# Patient Record
Sex: Female | Born: 1990 | Race: Black or African American | Hispanic: No | Marital: Single
Health system: Southern US, Community
[De-identification: ages and names within clinical notes are randomized; demographics above are authoritative.]

## PROBLEM LIST (undated history)

## (undated) DIAGNOSIS — E119 Type 2 diabetes mellitus without complications: Secondary | ICD-10-CM

## (undated) DIAGNOSIS — I10 Essential (primary) hypertension: Secondary | ICD-10-CM

## (undated) HISTORY — DX: Type 2 diabetes mellitus without complications: E11.9

---
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
## 2011-10-15 ENCOUNTER — Emergency Department: Payer: Self-pay | Admitting: Emergency Medicine

## 2011-10-16 ENCOUNTER — Observation Stay: Payer: Self-pay | Admitting: Internal Medicine

## 2013-02-19 IMAGING — CT CT HEAD WITHOUT CONTRAST
2 series · 16 of 30 positions shown, 20 images · non-contrast
Comparison: none

REASON FOR EXAM: constant shaking of aRMS/?CHOREA
COMMENTS:

PROCEDURE:     CT  - CT HEAD WITHOUT CONTRAST  - October 16, 2011  [DATE]
RESULT:     Comparison:  None
TECHNIQUE: Multiple axial images from the foramen magnum to the vertex were
obtained without IV contrast.

[Series 2: without · axial · non-contrast · 0.43mm/px · z∈[-272,-152]mm · 13 of 30 slices shown, 17 images]
[im 3/30  brain]
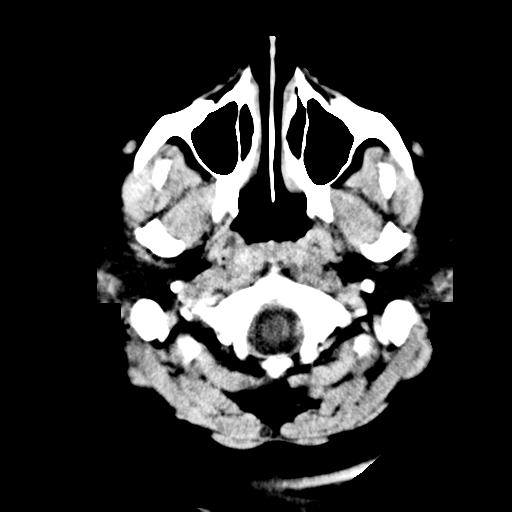
[im 3/30  bone]
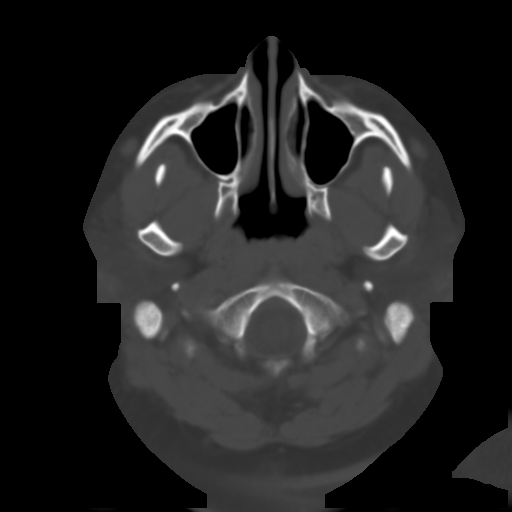
[im 5/30  brain]
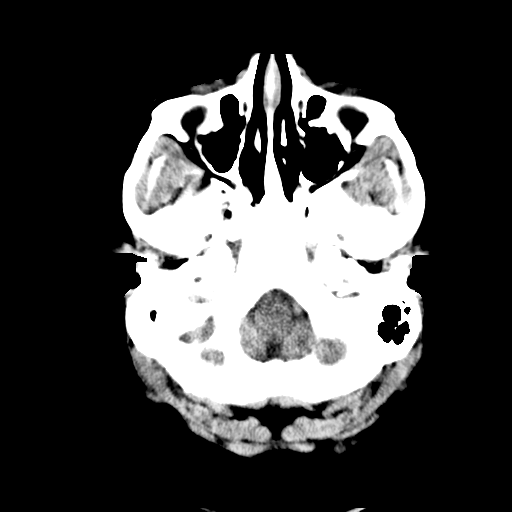
[im 7/30  brain]
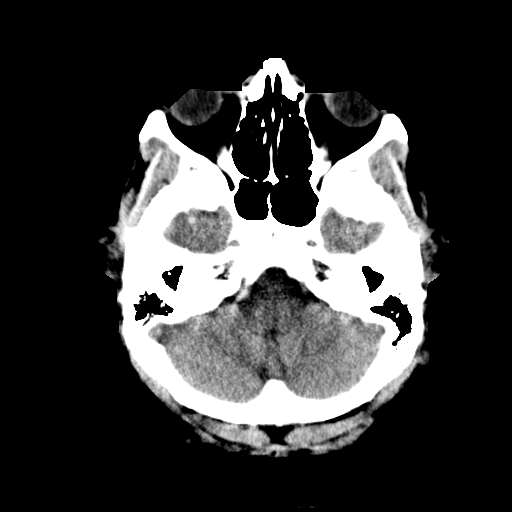
[im 9/30  brain]
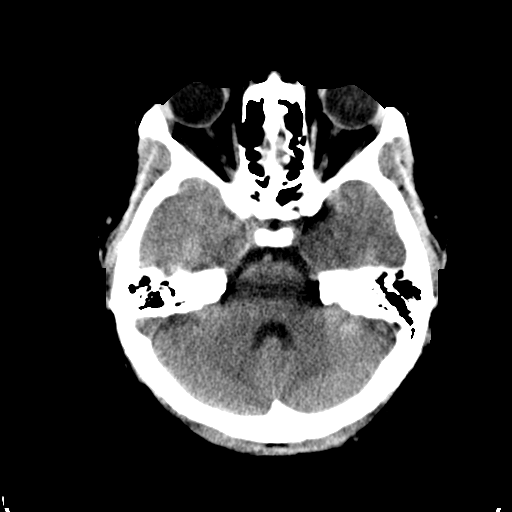
[im 11/30  brain]
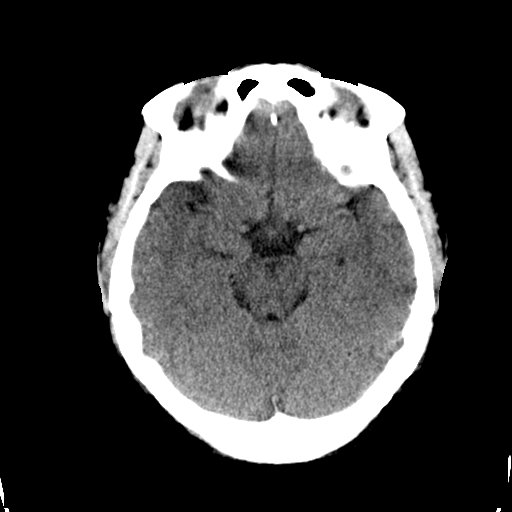
[im 11/30  bone]
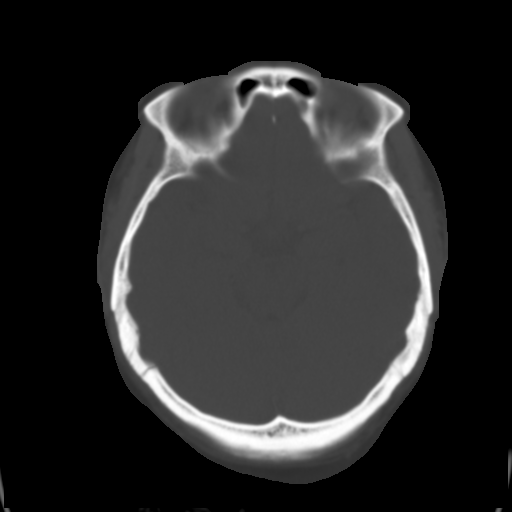
[im 13/30  brain]
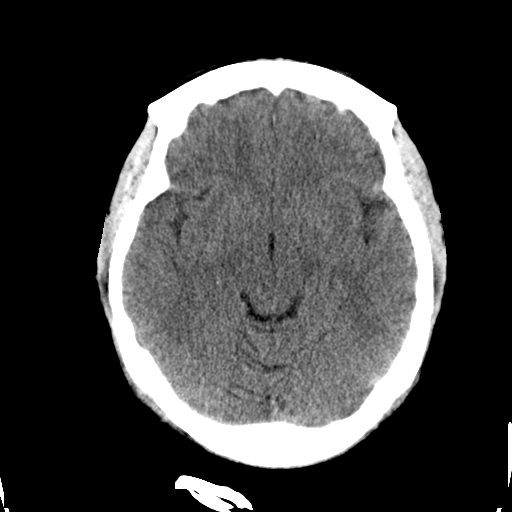
[im 15/30  brain]
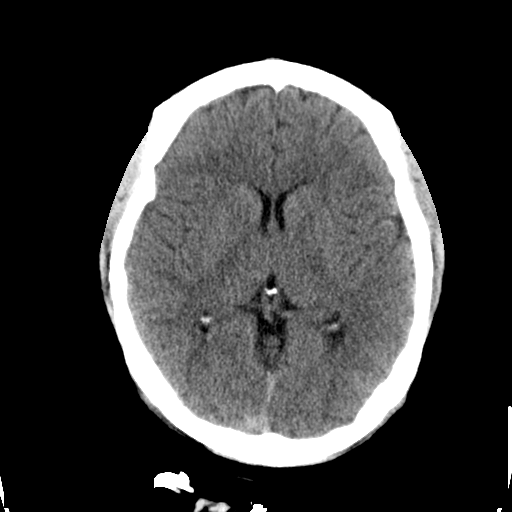
[im 17/30  brain]
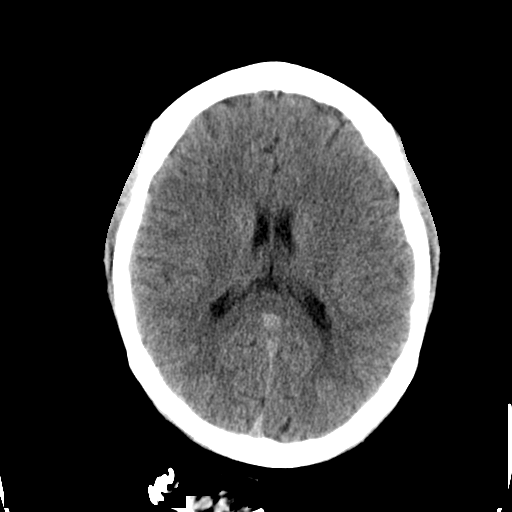
[im 19/30  brain]
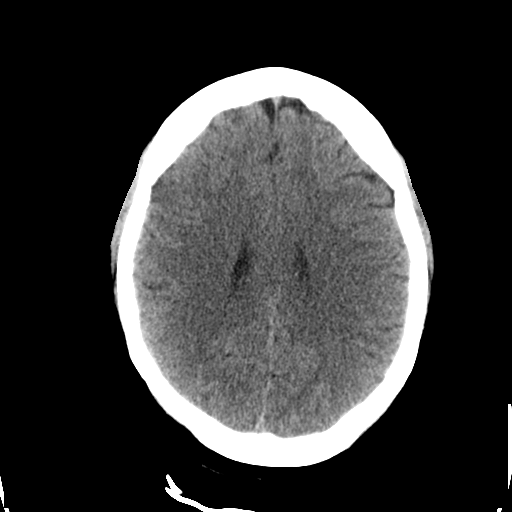
[im 19/30  bone]
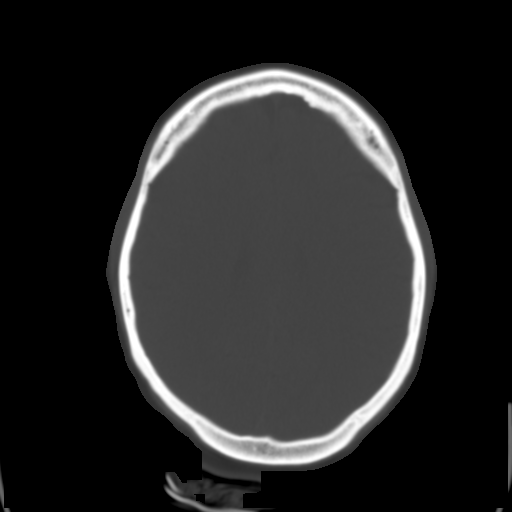
[im 21/30  brain]
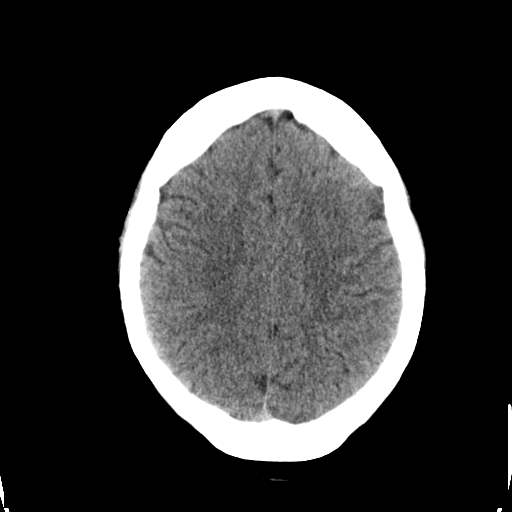
[im 23/30  brain]
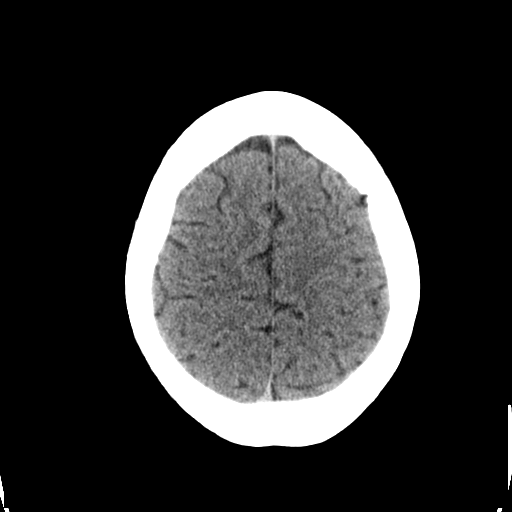
[im 25/30  brain]
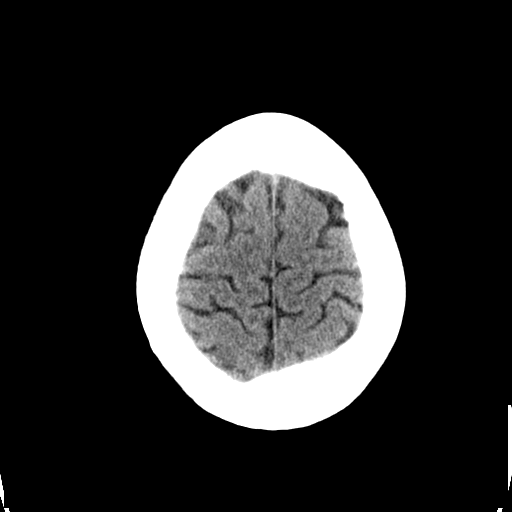
[im 27/30  brain]
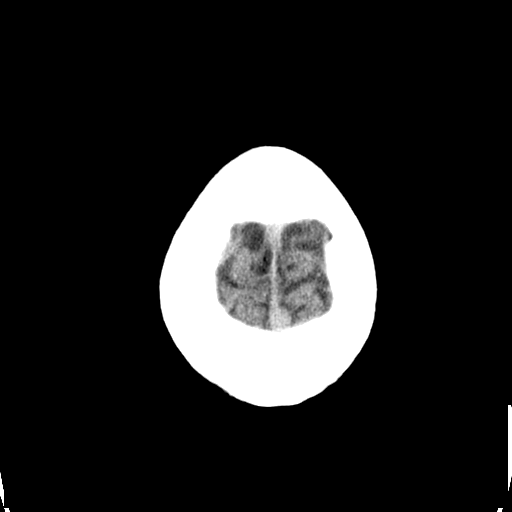
[im 27/30  bone]
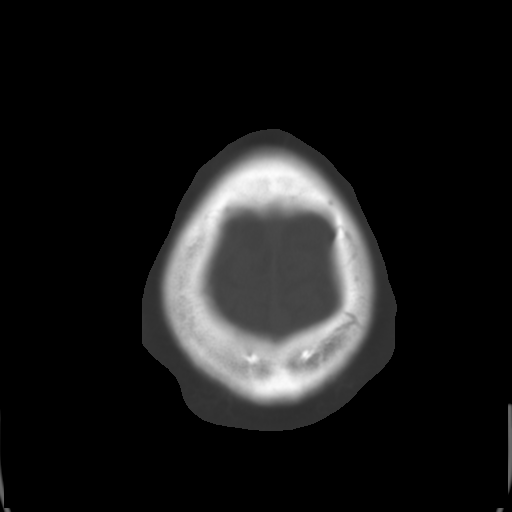

[Series 3: bone · axial · 0.43mm/px · z∈[-272,-232]mm · 3 of 30 slices shown]
[im 3/30  bone]
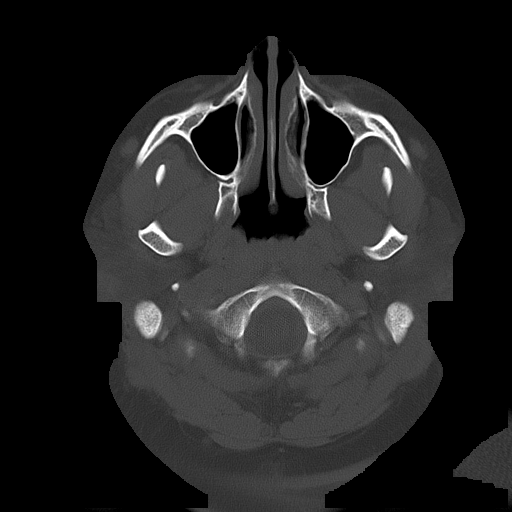
[im 7/30  bone]
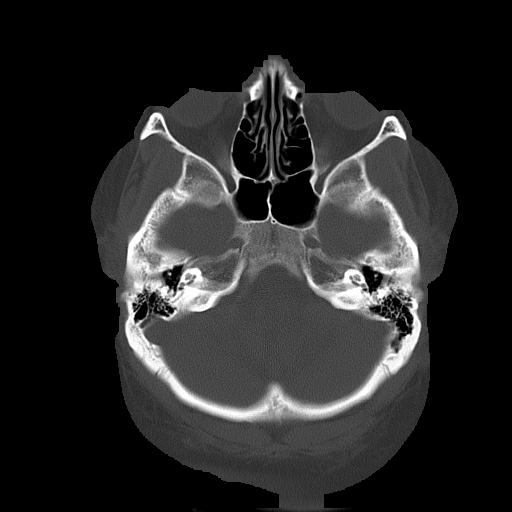
[im 11/30  bone]
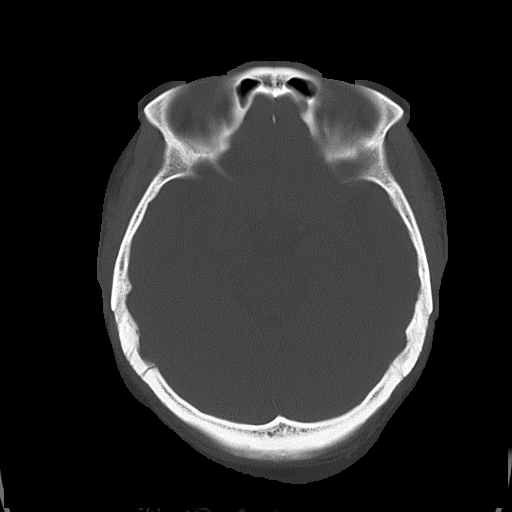

[16 of 30 positions shown; findings below may reference images not displayed]

FINDINGS: There is no evidence for mass effect, midline shift, or extra-axial fluid
collections. There is no evidence for space-occupying lesion, intracranial
hemorrhage, or cortical-based area of infarction.

The osseous structures are unremarkable.
IMPRESSION: No acute intracranial process.

## 2021-01-02 ENCOUNTER — Emergency Department: Payer: Self-pay

## 2021-01-02 ENCOUNTER — Other Ambulatory Visit: Payer: Self-pay

## 2021-01-02 ENCOUNTER — Emergency Department
Admission: EM | Admit: 2021-01-02 | Discharge: 2021-01-02 | Disposition: A | Discharge status: Home / Self Care | Payer: Self-pay | Attending: Emergency Medicine | Admitting: Emergency Medicine

## 2021-01-02 DIAGNOSIS — M25471 Effusion, right ankle: Secondary | ICD-10-CM

## 2021-01-02 DIAGNOSIS — M79671 Pain in right foot: Secondary | ICD-10-CM | POA: Insufficient documentation

## 2021-01-02 DIAGNOSIS — R2241 Localized swelling, mass and lump, right lower limb: Secondary | ICD-10-CM | POA: Insufficient documentation

## 2021-01-02 DIAGNOSIS — I10 Essential (primary) hypertension: Secondary | ICD-10-CM | POA: Insufficient documentation

## 2021-01-02 MED ORDER — NAPROXEN 375 MG PO TABS: 375.0000 mg | ORAL_TABLET | Freq: Two times a day (BID) | ORAL | 0 refills | Status: DC

## 2021-01-02 MED ORDER — PREDNISONE 20 MG PO TABS
60.0000 mg | ORAL_TABLET | Freq: Once | ORAL | Status: AC
  Administered 2021-01-02: 60 mg via ORAL
  Filled 2021-01-02: qty 3

## 2021-01-02 NOTE — ED Provider Notes (Signed)
[facility] Emergency Department Provider Note ____________________________________________  Time seen: 1115  I have reviewed the triage vital signs and the nursing notes.  HISTORY  Chief Complaint  Ankle Pain and Foot Pain   HPI Deborah Meyers is a 30 y.o. female presents to the ER today with complaint of right heel pain.  She reports this started last night.  She describes the pain as sharp and stabbing.  The pain radiates up the back of her foot into her calf.  The pain is worse with weightbearing.  She has noticed some swelling around the ankle but denies redness or warmth of the right lower extremity.  She does have some associated tingling in the right foot but denies numbness or weakness.  She denies any injury to the area.  She has tried Advil migraine OTC with minimal relief of symptoms.  She has no family history of clotting disorders. No past medical history on file.  There are no problems to display for this patient.   Prior to Admission medications   Medication Sig Start Date End Date Taking? Authorizing Provider  naproxen (NAPROSYN) 375 MG tablet Take 1 tablet (375 mg total) by mouth 2 (two) times daily with a meal. 01/02/21  Yes Baity, Salvadore Oxford, NP    Allergies Patient has no allergy information on record.  No family history on file.  Social History    Review of Systems  Constitutional: Negative for fever, chills or body aches. Cardiovascular: Negative for chest pain or chest tightness. Respiratory: Negative for cough or shortness of breath. Musculoskeletal: Positive for right heel pain, right ankle swelling, difficulty with gait.  Negative for back, hip or knee pain. Skin: Negative for redness, warmth or rash. Neurological: Positive for tingling of her right foot.  Negative for focal weakness or numbness. ____________________________________________  PHYSICAL EXAM:  VITAL SIGNS: ED Triage Vitals  Enc Vitals Group     BP 01/02/21  1040 (!) 190/115     Pulse Rate 01/02/21 1039 (!) 112     Resp 01/02/21 1039 18     Temp 01/02/21 1039 98.9 F (37.2 C)     Temp Source 01/02/21 1039 Oral     SpO2 01/02/21 1039 98 %     Weight 01/02/21 1038 (!) 330 lb (149.7 kg)     Height 01/02/21 1038 5\' 2"  (1.575 m)     Head Circumference --      Peak Flow --      Pain Score 01/02/21 1038 8     Pain Loc --      Pain Edu? --      Excl. in GC? --     Constitutional: Alert and oriented.  Obese, in no distress. Head: Normocephalic and atraumatic. Eyes: Normal extraocular movements Cardiovascular: Tachycardic, regular rhythm.  Pedal pulse 2+ on the right. Respiratory: Normal respiratory effort. No wheezes/rales/rhonchi. Musculoskeletal: Normal flexion, extension and rotation of the right ankle.  Pain with palpation under the right heel and with palpation of the Achilles. 1+ swelling noted over the lateral and medial malleoli. Strength 5/5 BLE.  Limping gait. Neurologic: Normal speech and language.  Sensation intact to RLE. Skin:  Skin is warm, dry and intact. No redness, warmth or rash noted. ____________________________________________   RADIOLOGY  Imaging Orders     DG Foot Complete Right IMPRESSION:  Negative.    ____________________________________________   INITIAL IMPRESSION / ASSESSMENT AND PLAN / ED COURSE  Right Heel Pain, Right Ankle Swelling, HTN, Morbid Obesity:  DDx include calcaneal spur, plantar fascitis, ankle sprain/strain, achilles tendonitis Xray right foot negative for heel spur Prednisone 60 mg PO x 1 Will place her in a post op shoe for comfort Encourage elevation and ice RX for Naproxen 375 mg BID x 3 days She can follow up with podiatry as an outpatient if needed She reports she has had untreated HTN for years. She denies dizziness, frequent headache, visual changes, chest pain or SOB. She does not want to start antihypertensive medications at this time- mainly due to lack of insurance,  financial constraints and lack of follow up. Referred her to the [facility], and encouraged her to reapply for Medicaid. Discussed the risks of heart attack, stroke and death. Pt understands these risks and wishes to be discharged without blood pressure medication at this time.       I reviewed the patient's prescription history over the last 12 months in the multi-state controlled substances database(s) that includes [place], [place], [place], [place], [place], [place], [place], [place], [place], [place], [place], [place], [place], and [place].  Results were notable for no recent controlled substances. ____________________________________________  FINAL CLINICAL IMPRESSION(S) / ED DIAGNOSES  Final diagnoses:  Pain of right heel  Right ankle swelling  Primary hypertension  Morbid obesity (HCC)      Lorre Munroe, NP 01/02/21 1206    Jene Every, MD 01/02/21 1222

## 2021-01-02 NOTE — Discharge Instructions (Addendum)
You were seen today for right heel pain.  Your x-ray was negative for any acute findings.  I am given you prescription for anti-inflammatories, please take as prescribed with food.  Please avoid taking any other ibuprofen, Advil, Motrin, aspirin, Aleve or naproxen OTC.  Wear the postop shoe for comfort while up and ambulating.  You may take this off while you are sleeping.  I encouraged ice and elevation.  Please follow-up with podiatry if pain persist or worsen.  Please establish care with [facility] to follow-up on your blood pressure.

## 2021-01-02 NOTE — ED Triage Notes (Signed)
Pt reports pain to her right foot since last pm and pain in her right heel that started today. Pt denies injuries.

## 2022-05-09 IMAGING — DX DG FOOT COMPLETE 3+V*R*
3 series · 3 of 3 positions shown · non-contrast
Comparison: None.

CLINICAL DATA: Acute RIGHT heel pain for 1 day. No known injury.
Initial encounter.

EXAM:
RIGHT FOOT COMPLETE - 3+ VIEW

[foot ap]
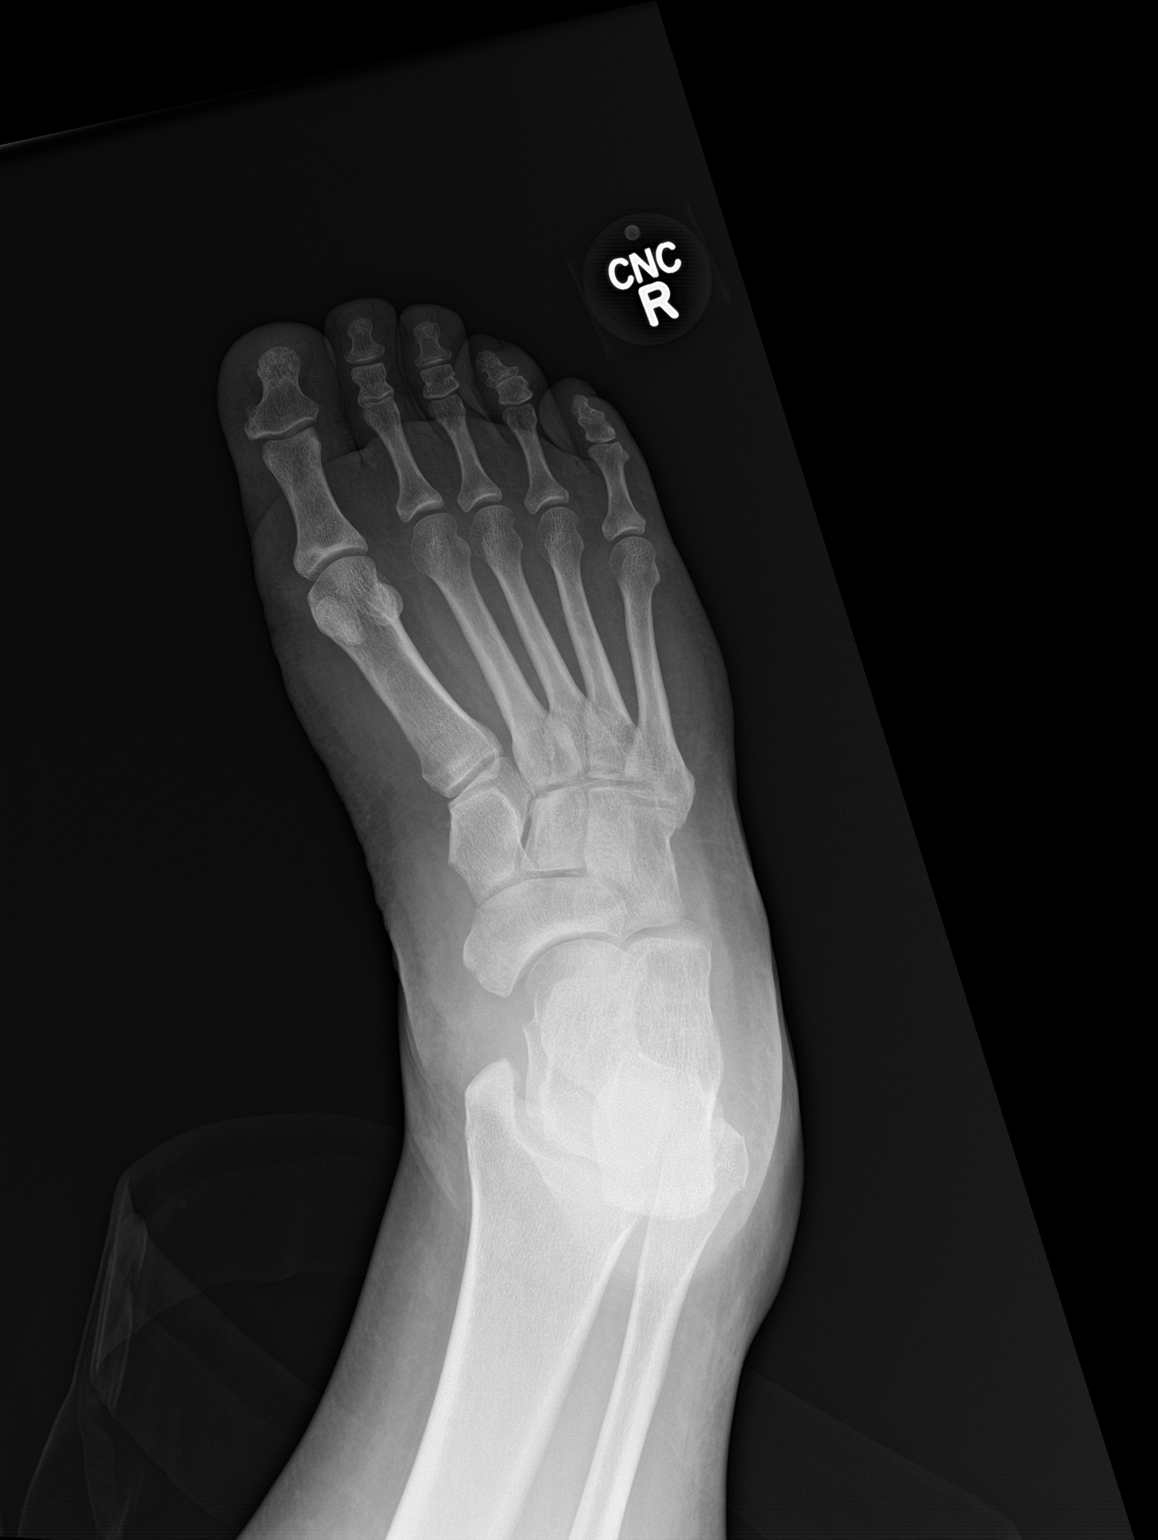

[foot obl]
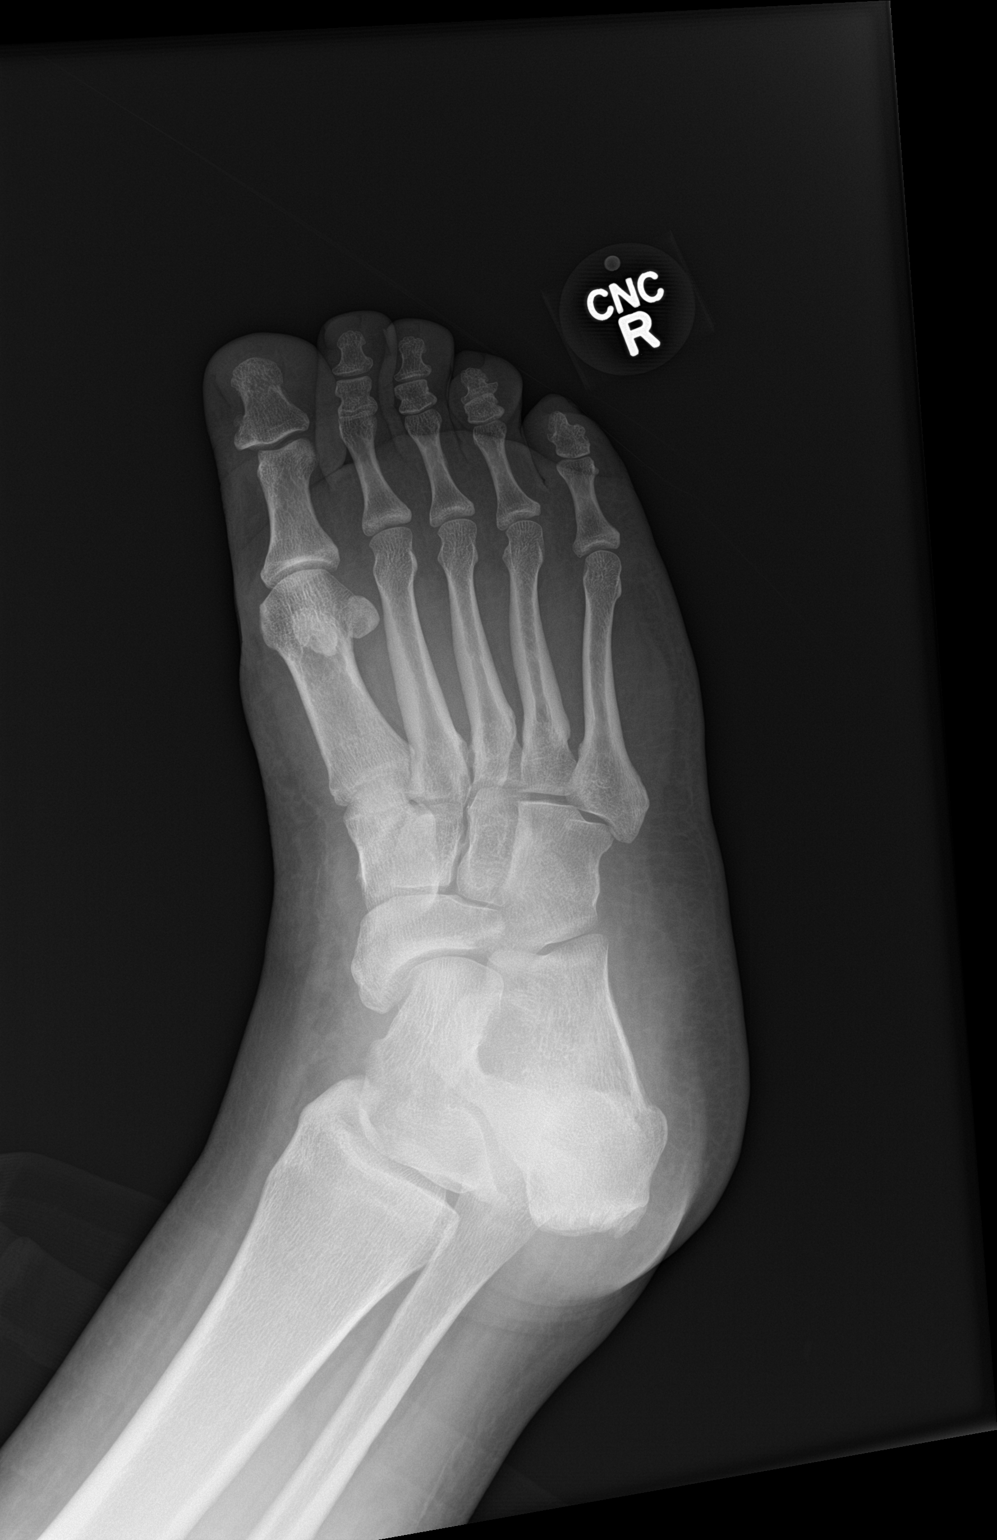

[foot lat]
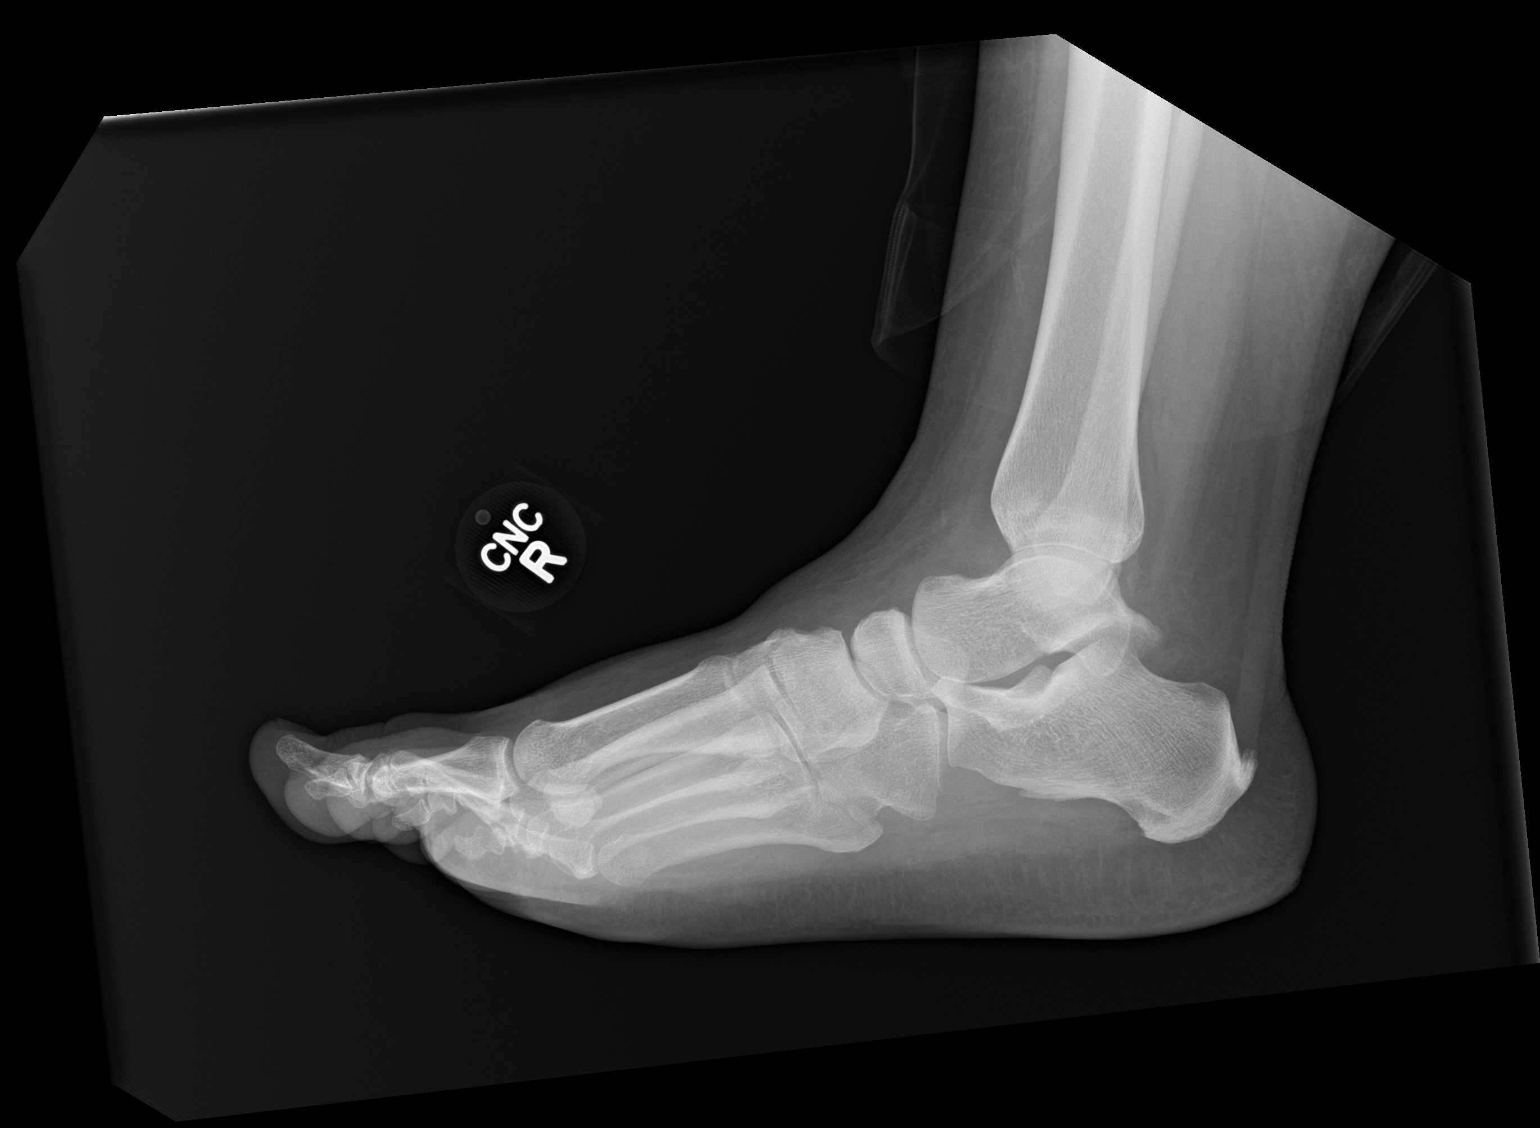

[3 of 3 positions shown; findings below may reference images not displayed]

FINDINGS: There is no evidence of fracture or dislocation. There is no
evidence of arthropathy or other focal bone abnormality. Soft
tissues are unremarkable.
IMPRESSION: Negative.

## 2023-09-16 ENCOUNTER — Observation Stay
Admission: EM | Admit: 2023-09-16 | Discharge: 2023-09-18 | Disposition: A | Discharge status: Home / Self Care | Payer: MEDICAID | Attending: Obstetrics and Gynecology | Admitting: Obstetrics and Gynecology

## 2023-09-16 ENCOUNTER — Observation Stay: Payer: Self-pay

## 2023-09-16 ENCOUNTER — Other Ambulatory Visit: Payer: Self-pay

## 2023-09-16 DIAGNOSIS — R739 Hyperglycemia, unspecified: Principal | ICD-10-CM | POA: Diagnosis present

## 2023-09-16 DIAGNOSIS — E1122 Type 2 diabetes mellitus with diabetic chronic kidney disease: Secondary | ICD-10-CM | POA: Insufficient documentation

## 2023-09-16 DIAGNOSIS — N1831 Chronic kidney disease, stage 3a: Secondary | ICD-10-CM | POA: Diagnosis not present

## 2023-09-16 DIAGNOSIS — Z794 Long term (current) use of insulin: Secondary | ICD-10-CM | POA: Diagnosis not present

## 2023-09-16 DIAGNOSIS — E1165 Type 2 diabetes mellitus with hyperglycemia: Secondary | ICD-10-CM | POA: Diagnosis not present

## 2023-09-16 DIAGNOSIS — L680 Hirsutism: Secondary | ICD-10-CM | POA: Insufficient documentation

## 2023-09-16 DIAGNOSIS — Z23 Encounter for immunization: Secondary | ICD-10-CM | POA: Diagnosis not present

## 2023-09-16 DIAGNOSIS — E282 Polycystic ovarian syndrome: Secondary | ICD-10-CM | POA: Diagnosis not present

## 2023-09-16 DIAGNOSIS — R5381 Other malaise: Secondary | ICD-10-CM | POA: Diagnosis present

## 2023-09-16 DIAGNOSIS — I129 Hypertensive chronic kidney disease with stage 1 through stage 4 chronic kidney disease, or unspecified chronic kidney disease: Secondary | ICD-10-CM | POA: Diagnosis not present

## 2023-09-16 DIAGNOSIS — I10 Essential (primary) hypertension: Secondary | ICD-10-CM | POA: Insufficient documentation

## 2023-09-16 HISTORY — DX: Essential (primary) hypertension: I10

## 2023-09-16 LAB — LIPID PANEL
Cholesterol: 158 mg/dL (ref 0–200)
HDL: 27 mg/dL — ABNORMAL LOW (ref >40)
LDL Cholesterol: 86 mg/dL (ref 0–99)
Total CHOL/HDL Ratio: 5.9 {ratio}
Triglycerides: 223 mg/dL — ABNORMAL HIGH (ref <150)
VLDL: 45 mg/dL — ABNORMAL HIGH (ref 0–40)

## 2023-09-16 LAB — BASIC METABOLIC PANEL
Anion gap: 11 (ref 5–15)
BUN: 14 mg/dL (ref 6–20)
CO2: 23 mmol/L (ref 22–32)
Calcium: 8.5 mg/dL — ABNORMAL LOW (ref 8.9–10.3)
Chloride: 97 mmol/L — ABNORMAL LOW (ref 98–111)
Creatinine, Ser: 1.58 mg/dL — ABNORMAL HIGH (ref 0.44–1.00)
GFR, Estimated: 44 mL/min — ABNORMAL LOW (ref >60)
Glucose, Bld: 781 mg/dL (ref 70–99)
Potassium: 4.2 mmol/L (ref 3.5–5.1)
Sodium: 131 mmol/L — ABNORMAL LOW (ref 135–145)

## 2023-09-16 LAB — URINALYSIS, ROUTINE W REFLEX MICROSCOPIC
Bacteria, UA: NONE SEEN
Bilirubin Urine: NEGATIVE
Glucose, UA: >=500 mg/dL — AB
Ketones, ur: NEGATIVE mg/dL
Leukocytes,Ua: NEGATIVE
Nitrite: NEGATIVE
Protein, ur: NEGATIVE mg/dL
RBC / HPF: >50 RBC/hpf (ref 0–5)
Specific Gravity, Urine: 1.028 (ref 1.005–1.030)
pH: 6 (ref 5.0–8.0)

## 2023-09-16 LAB — CBG MONITORING, ED
Glucose-Capillary: >600 mg/dL (ref 70–99)
Glucose-Capillary: >600 mg/dL (ref 70–99)
Glucose-Capillary: 493 mg/dL — ABNORMAL HIGH (ref 70–99)

## 2023-09-16 LAB — CBC
HCT: 39.2 % (ref 36.0–46.0)
Hemoglobin: 11.3 g/dL — ABNORMAL LOW (ref 12.0–15.0)
MCH: 20.6 pg — ABNORMAL LOW (ref 26.0–34.0)
MCHC: 28.8 g/dL — ABNORMAL LOW (ref 30.0–36.0)
MCV: 71.4 fL — ABNORMAL LOW (ref 80.0–100.0)
Platelets: 394 10*3/uL (ref 150–400)
RBC: 5.49 MIL/uL — ABNORMAL HIGH (ref 3.87–5.11)
RDW: 18 % — ABNORMAL HIGH (ref 11.5–15.5)
WBC: 9.5 10*3/uL (ref 4.0–10.5)
nRBC: 0 % (ref 0.0–0.2)

## 2023-09-16 LAB — GLUCOSE, RANDOM: Glucose, Bld: 502 mg/dL (ref 70–99)

## 2023-09-16 LAB — SODIUM, URINE, RANDOM: Sodium, Ur: 14 mmol/L

## 2023-09-16 LAB — POC URINE PREG, ED: Preg Test, Ur: NEGATIVE

## 2023-09-16 LAB — GLUCOSE, CAPILLARY: Glucose-Capillary: 191 mg/dL — ABNORMAL HIGH (ref 70–99)

## 2023-09-16 LAB — OSMOLALITY: Osmolality: 325 mosm/kg (ref 275–295)

## 2023-09-16 LAB — CREATININE, URINE, RANDOM: Creatinine, Urine: 53 mg/dL

## 2023-09-16 LAB — HIV ANTIBODY (ROUTINE TESTING W REFLEX): HIV Screen 4th Generation wRfx: NONREACTIVE

## 2023-09-16 MED ORDER — INSULIN GLARGINE-YFGN 100 UNIT/ML ~~LOC~~ SOLN
20.0000 [IU] | Freq: Every day | SUBCUTANEOUS | Status: DC
  Administered 2023-09-16: 20 [IU] via SUBCUTANEOUS
  Filled 2023-09-16: qty 0.2

## 2023-09-16 MED ORDER — CARVEDILOL 6.25 MG PO TABS
6.2500 mg | ORAL_TABLET | Freq: Two times a day (BID) | ORAL | Status: DC
  Administered 2023-09-16 – 2023-09-18 (×4): 6.25 mg via ORAL
  Filled 2023-09-16 (×4): qty 1

## 2023-09-16 MED ORDER — ONDANSETRON HCL 4 MG/2ML IJ SOLN: 4.0000 mg | Freq: Four times a day (QID) | INTRAMUSCULAR | Status: DC | PRN

## 2023-09-16 MED ORDER — INSULIN ASPART 100 UNIT/ML IJ SOLN
10.0000 [IU] | Freq: Three times a day (TID) | INTRAMUSCULAR | Status: DC
  Administered 2023-09-17 – 2023-09-18 (×5): 10 [IU] via SUBCUTANEOUS
  Filled 2023-09-16 (×5): qty 1

## 2023-09-16 MED ORDER — ACETAMINOPHEN 325 MG PO TABS
650.0000 mg | ORAL_TABLET | Freq: Four times a day (QID) | ORAL | Status: DC | PRN
  Administered 2023-09-17 (×2): 650 mg via ORAL
  Filled 2023-09-16 (×2): qty 2

## 2023-09-16 MED ORDER — INSULIN ASPART 100 UNIT/ML IJ SOLN
0.0000 [IU] | Freq: Every day | INTRAMUSCULAR | Status: DC
  Administered 2023-09-17: 3 [IU] via SUBCUTANEOUS
  Filled 2023-09-16: qty 1

## 2023-09-16 MED ORDER — CARVEDILOL 3.125 MG PO TABS: 3.1250 mg | ORAL_TABLET | Freq: Two times a day (BID) | ORAL | Status: DC

## 2023-09-16 MED ORDER — SODIUM CHLORIDE 0.9 % IV BOLUS
1000.0000 mL | Freq: Once | INTRAVENOUS | Status: AC
  Administered 2023-09-16: 1000 mL via INTRAVENOUS

## 2023-09-16 MED ORDER — INSULIN ASPART 100 UNIT/ML IJ SOLN
10.0000 [IU] | Freq: Once | INTRAMUSCULAR | Status: AC
  Administered 2023-09-16: 10 [IU] via SUBCUTANEOUS
  Filled 2023-09-16: qty 1

## 2023-09-16 MED ORDER — ONDANSETRON HCL 4 MG PO TABS: 4.0000 mg | ORAL_TABLET | Freq: Four times a day (QID) | ORAL | Status: DC | PRN

## 2023-09-16 MED ORDER — SODIUM CHLORIDE 0.9 % IV SOLN: INTRAVENOUS | Status: DC

## 2023-09-16 MED ORDER — INSULIN ASPART 100 UNIT/ML IJ SOLN
0.0000 [IU] | Freq: Three times a day (TID) | INTRAMUSCULAR | Status: DC
  Administered 2023-09-16: 20 [IU] via SUBCUTANEOUS
  Administered 2023-09-17: 7 [IU] via SUBCUTANEOUS
  Administered 2023-09-17 (×2): 11 [IU] via SUBCUTANEOUS
  Administered 2023-09-18 (×2): 20 [IU] via SUBCUTANEOUS
  Filled 2023-09-16 (×6): qty 1

## 2023-09-16 MED ORDER — SODIUM CHLORIDE 0.9 % IV BOLUS
1000.0000 mL | INTRAVENOUS | Status: AC
Start: 2023-09-16 — End: 2023-09-16
  Administered 2023-09-16 (×2): 1000 mL via INTRAVENOUS

## 2023-09-16 MED ORDER — INSULIN ASPART 100 UNIT/ML IJ SOLN
8.0000 [IU] | Freq: Three times a day (TID) | INTRAMUSCULAR | Status: DC
  Administered 2023-09-16: 8 [IU] via SUBCUTANEOUS
  Filled 2023-09-16: qty 1

## 2023-09-16 MED ORDER — INSULIN GLARGINE-YFGN 100 UNIT/ML ~~LOC~~ SOLN
30.0000 [IU] | Freq: Every day | SUBCUTANEOUS | Status: DC
  Administered 2023-09-17 – 2023-09-18 (×2): 30 [IU] via SUBCUTANEOUS
  Filled 2023-09-16 (×2): qty 0.3

## 2023-09-16 MED ORDER — ACETAMINOPHEN 650 MG RE SUPP: 650.0000 mg | Freq: Four times a day (QID) | RECTAL | Status: DC | PRN

## 2023-09-16 NOTE — ED Notes (Signed)
Dr. Chipper Herb paged for critical lab results.

## 2023-09-16 NOTE — ED Notes (Signed)
Pt ambulated to restroom without difficulty

## 2023-09-16 NOTE — ED Notes (Signed)
Patient is tearful, appears anxious. Patient attempted to give urine sample and gave [facility] ED tech a specimen bottle, but stated per Duwayne Heck that she added "toilet water" to the specimen because she was afraid "it wasn't enough." Specimen was not sent.

## 2023-09-16 NOTE — ED Notes (Signed)
Cbg of Hi was obtained prior to Insulin administration.

## 2023-09-16 NOTE — ED Notes (Signed)
Hospitalist at bedside. IV team was called and states that the patient is next in line.

## 2023-09-16 NOTE — ED Notes (Signed)
Report given to Natalie RN

## 2023-09-16 NOTE — ED Notes (Signed)
This RN notified Felix Ahmadi, MD of CBG of 493 at [phone] and drew lab verification. At 1701, this RN notified Felix Ahmadi, MD that lab verification of glucose was 502 and this RN inquired as to how MD wanted to dose sliding scale insulin - order parameters only cover CBG up to 400. Per MD, cover pt with 20 units of sliding scale insulin.

## 2023-09-16 NOTE — ED Triage Notes (Signed)
Pt to ED via POV from home. Pt reports increased urinary frequency for the past week. Pt reports burning sensation this morning. Pt believes she has a UTI. Pt denies abd pain, N/V/D. Pt reports hx HTN and has not been prescribed medication.

## 2023-09-16 NOTE — H&P (Signed)
History and Physical    Deborah Meyers MWU:[id number] DOB: Jun 19, 1991 DOA: 09/16/2023  PCP: Pcp, No (Confirm with patient/family/NH records and if not entered, this has to be entered at [facility] point of entry) Patient coming from: Home  I have personally briefly reviewed patient's old medical records in [facility]  Chief Complaint: Thirsty, frequent urine  HPI: Deborah Meyers is a 32 y.o. female with medical history significant of morbid obesity, HTN, presented with 7 days of feeling constantly thirsty, increased urination and fatigue.  Denied any pain no shortness breath no diarrhea no nauseous vomiting.  ED Course: Afebrile, tachycardia, blood pressure elevated, oxygen saturation 100% on room air.  Blood work showed glucose 781, anion gap 11, bicarb 23, creatinine 1.5, K4.2.  WBC 9.5, hemoglobin 11.3, pregnancy test negative.  Review of Systems: As per HPI otherwise 14 point review of systems negative.    Past Medical History:  Diagnosis Date   Hypertension     History reviewed. No pertinent surgical history.   reports that she has never smoked. She has never used smokeless tobacco. No history on file for alcohol use and drug use.  Not on File  History reviewed. No pertinent family history.   Prior to Admission medications   Medication Sig Start Date End Date Taking? Authorizing Provider  naproxen (NAPROSYN) 375 MG tablet Take 1 tablet (375 mg total) by mouth 2 (two) times daily with a meal. Patient not taking: Reported on 09/16/2023 01/02/21   Lorre Munroe, NP    Physical Exam: Vitals:   09/16/23 0917 09/16/23 1007 09/16/23 1030 09/16/23 1100  BP: (!) 166/124 (!) 152/121 (!) 157/114 (!) 152/105  Pulse: (!) 110  (!) 118 (!) 112  Resp: 20  20 (!) 27  Temp: 98 F (36.7 C)     TempSrc: Oral     SpO2: 98%  98% 99%    Constitutional: NAD, calm, comfortable Vitals:   09/16/23 0917 09/16/23 1007 09/16/23 1030 09/16/23 1100  BP: (!) 166/124 (!) 152/121 (!)  157/114 (!) 152/105  Pulse: (!) 110  (!) 118 (!) 112  Resp: 20  20 (!) 27  Temp: 98 F (36.7 C)     TempSrc: Oral     SpO2: 98%  98% 99%   Eyes: PERRL, lids and conjunctivae normal ENMT: Mucous membranes are moist. Posterior pharynx clear of any exudate or lesions.Normal dentition.  Neck: normal, supple, no masses, no thyromegaly Respiratory: clear to auscultation bilaterally, no wheezing, no crackles. Normal respiratory effort. No accessory muscle use.  Cardiovascular: Regular rate and rhythm, no murmurs / rubs / gallops. No extremity edema. 2+ pedal pulses. No carotid bruits.  Abdomen: no tenderness, no masses palpated. No hepatosplenomegaly. Bowel sounds positive.  Musculoskeletal: no clubbing / cyanosis. No joint deformity upper and lower extremities. Good ROM, no contractures. Normal muscle tone.  Skin: Female hair distribution Neurologic: CN 2-12 grossly intact. Sensation intact, DTR normal. Strength 5/5 in all 4.  Psychiatric: Normal judgment and insight. Alert and oriented x 3. Normal mood.     Labs on Admission: I have personally reviewed following labs and imaging studies  CBC: Recent Labs  Lab 09/16/23 0928  WBC 9.5  HGB 11.3*  HCT 39.2  MCV 71.4*  PLT 394   Basic Metabolic Panel: Recent Labs  Lab 09/16/23 0928  NA 131*  K 4.2  CL 97*  CO2 23  GLUCOSE 781*  BUN 14  CREATININE 1.58*  CALCIUM 8.5*   GFR: CrCl cannot be  calculated (Unknown ideal weight.). Liver Function Tests: No results for input(s): "AST", "ALT", "ALKPHOS", "BILITOT", "PROT", "ALBUMIN" in the last 168 hours. No results for input(s): "LIPASE", "AMYLASE" in the last 168 hours. No results for input(s): "AMMONIA" in the last 168 hours. Coagulation Profile: No results for input(s): "INR", "PROTIME" in the last 168 hours. Cardiac Enzymes: No results for input(s): "CKTOTAL", "CKMB", "CKMBINDEX", "TROPONINI" in the last 168 hours. BNP (last 3 results) No results for input(s): "PROBNP" in the  last 8760 hours. HbA1C: No results for input(s): "HGBA1C" in the last 72 hours. CBG: No results for input(s): "GLUCAP" in the last 168 hours. Lipid Profile: Recent Labs    09/16/23 0928  CHOL 158  HDL 27*  LDLCALC 86  TRIG 161*  CHOLHDL 5.9   Thyroid Function Tests: No results for input(s): "TSH", "T4TOTAL", "FREET4", "T3FREE", "THYROIDAB" in the last 72 hours. Anemia Panel: No results for input(s): "VITAMINB12", "FOLATE", "FERRITIN", "TIBC", "IRON", "RETICCTPCT" in the last 72 hours. Urine analysis:    Component Value Date/Time   COLORURINE YELLOW (A) 09/16/2023 0917   APPEARANCEUR HAZY (A) 09/16/2023 0917   LABSPEC 1.028 09/16/2023 0917   PHURINE 6.0 09/16/2023 0917   GLUCOSEU >=500 (A) 09/16/2023 0917   HGBUR LARGE (A) 09/16/2023 0917   BILIRUBINUR NEGATIVE 09/16/2023 0917   KETONESUR NEGATIVE 09/16/2023 0917   PROTEINUR NEGATIVE 09/16/2023 0917   NITRITE NEGATIVE 09/16/2023 0917   LEUKOCYTESUR NEGATIVE 09/16/2023 0917    Radiological Exams on Admission: No results found.  EKG: Independently reviewed.  Sinus tachycardia, no acute ST changes, LVH.  Assessment/Plan Principal Problem:   Hyperglycemia Active Problems:   Uncontrolled type 2 diabetes mellitus with hyperglycemia, without long-term current use of insulin (HCC)  (please populate well all problems here in Problem List. (For example, if patient is on BP meds at home and you resume or decide to hold them, it is a problem that needs to be her. Same for CAD, COPD, HLD and so on)  New onset diabetes Mild HHS Metabolic syndrome -Estimated A1c>10, likely will need to go home with insulin at least for few months. -Plan to control glucose by subcu insulin.  Start Lantus 20 units daily and NovoLog 8 units 3 times daily AC plus sliding scale. -Consider metformin once kidney function stabilized. -Check C-peptide and proinsulin/insulin ratio -Outpatient endocrinology follow-up -Triglyceride> 223, outpatient  endocrinology follow-up.  AKI vs CKD -Will give a total of 3 L IV bolus then check renal ultrasound and FENa -BMP tomorrow  Hirsutism -Patient reports irregular menstrual period and significant menorrhagia and dysmenorrhea, clinically suspect PCOS -Recommend patient outpatient follow-up with endocrinology she is and gynecology.  Patient concerned about insurance issue, will send case manager to help patient set up PCP and specialty care.  HTN -Start Coreg  Morbid obesity -BMI> 60, calorie control recommended.  DVT prophylaxis: SCD Code Status: Full code Family Communication: None at bedside Disposition Plan: Expect less than 2 midnight hospital stay Consults called: None Admission status: MedSurg observation  Emeline General MD [facility] Pager [phone]  09/16/2023, 12:33 PM

## 2023-09-16 NOTE — ED Notes (Signed)
Unable to place IV with two staff members' efforts. IV team has been consulted.

## 2023-09-16 NOTE — ED Notes (Signed)
Patient is on her period

## 2023-09-16 NOTE — TOC Initial Note (Signed)
Transition of Care [facility]) - Initial/Assessment Note    Patient Details  Name: Deborah Meyers MRN: [id number] Date of Birth: [birth year]  [facility]) CM/SW Contact:    Colette Ribas, LCSWA Phone Number: 09/16/2023, 11:42 AM  Clinical Narrative:                  CSW received consult for PCP referral. CSW spoke with patient bedside & advised PCP options will be added to AVS.       Patient Goals and CMS Choice            Expected Discharge Plan and Services                                              Prior Living Arrangements/Services                       Activities of Daily Living      Permission Sought/Granted                  Emotional Assessment              Admission diagnosis:  Hyperglycemia [R73.9] Patient Active Problem List   Diagnosis Date Noted   Uncontrolled type 2 diabetes mellitus with hyperglycemia, without long-term current use of insulin (HCC) 09/16/2023   Hyperglycemia 09/16/2023   PCP:  Pcp, No Pharmacy:  No Pharmacies Listed    Social Determinants of Health (SDOH) Social History: SDOH Screenings   Tobacco Use: Low Risk  (09/16/2023)   SDOH Interventions:     Readmission Risk Interventions     No data to display

## 2023-09-16 NOTE — ED Notes (Signed)
Jenna Poggie, PA-C is aware of blood glucose of 781.

## 2023-09-16 NOTE — ED Provider Notes (Signed)
[facility] Provider Note    Event Date/Time   First MD Initiated Contact with Patient 09/16/23 [phone]     (approximate)   History   Urinary Frequency   HPI  Deborah Meyers is a 32 y.o. female with a past medical history of obesity who presents today with urinary frequency that began yesterday.  Patient denies burning with urination or hematuria.  She denies nausea or vomiting.  She denies abdominal pain or back pain.  She denies history of diabetes.  No fevers or chills.  She reports that she has been drinking a lot of Kool-Aid recently.  Patient Active Problem List   Diagnosis Date Noted   Uncontrolled type 2 diabetes mellitus with hyperglycemia, without long-term current use of insulin (HCC) 09/16/2023   Hyperglycemia 09/16/2023          Physical Exam   Triage Vital Signs: ED Triage Vitals  Encounter Vitals Group     BP 09/16/23 0917 (!) 166/124     Systolic BP Percentile --      Diastolic BP Percentile --      Pulse Rate 09/16/23 0917 (!) 110     Resp 09/16/23 0917 20     Temp 09/16/23 0917 98 F (36.7 C)     Temp Source 09/16/23 0917 Oral     SpO2 09/16/23 0917 98 %     Weight --      Height --      Head Circumference --      Peak Flow --      Pain Score 09/16/23 0916 0     Pain Loc --      Pain Education --      Exclude from Growth Chart --     Most recent vital signs: Vitals:   09/16/23 1100 09/16/23 1354  BP: (!) 152/105 (!) 191/141  Pulse: (!) 112 (!) 122  Resp: (!) 27 (!) 24  Temp:    SpO2: 99% 100%    Physical Exam Vitals and nursing note reviewed.  Constitutional:      General: Awake and alert. No acute distress.    Appearance: Normal appearance. The patient is obese.  HENT:     Head: Normocephalic and atraumatic.     Mouth: Mucous membranes are moist.  Eyes:     General: PERRL. Normal EOMs        Right eye: No discharge.        Left eye: No discharge.     Conjunctiva/sclera: Conjunctivae normal.   Cardiovascular:     Rate and Rhythm: Normal rate and regular rhythm.     Pulses: Normal pulses.  Pulmonary:     Effort: Pulmonary effort is normal. No respiratory distress.     Breath sounds: Normal breath sounds.  Abdominal:     Abdomen is soft. There is no abdominal tenderness. No rebound or guarding. No distention. Musculoskeletal:        General: No swelling. Normal range of motion.     Cervical back: Normal range of motion and neck supple.  Skin:    General: Skin is warm and dry.     Capillary Refill: Capillary refill takes less than 2 seconds.     Findings: No rash.  Neurological:     Mental Status: The patient is awake and alert.      ED Results / Procedures / Treatments   Labs (all labs ordered are listed, but only abnormal results are displayed) Labs Reviewed  URINALYSIS,  ROUTINE W REFLEX MICROSCOPIC - Abnormal; Notable for the following components:      Result Value   Color, Urine YELLOW (*)    APPearance HAZY (*)    Glucose, UA >=500 (*)    Hgb urine dipstick LARGE (*)    All other components within normal limits  CBC - Abnormal; Notable for the following components:   RBC 5.49 (*)    Hemoglobin 11.3 (*)    MCV 71.4 (*)    MCH 20.6 (*)    MCHC 28.8 (*)    RDW 18.0 (*)    All other components within normal limits  BASIC METABOLIC PANEL - Abnormal; Notable for the following components:   Sodium 131 (*)    Chloride 97 (*)    Glucose, Bld 781 (*)    Creatinine, Ser 1.58 (*)    Calcium 8.5 (*)    GFR, Estimated 44 (*)    All other components within normal limits  LIPID PANEL - Abnormal; Notable for the following components:   Triglycerides 223 (*)    HDL 27 (*)    VLDL 45 (*)    All other components within normal limits  OSMOLALITY - Abnormal; Notable for the following components:   Osmolality 325 (*)    All other components within normal limits  CBG MONITORING, ED - Abnormal; Notable for the following components:   Glucose-Capillary >600 (*)    All  other components within normal limits  CBG MONITORING, ED - Abnormal; Notable for the following components:   Glucose-Capillary >600 (*)    All other components within normal limits  SODIUM, URINE, RANDOM  CREATININE, URINE, RANDOM  C-PEPTIDE  PROINSULIN/INSULIN RATIO  HEMOGLOBIN A1C  HIV ANTIBODY (ROUTINE TESTING W REFLEX)  POC URINE PREG, ED     EKG     RADIOLOGY     PROCEDURES:  Critical Care performed:   Procedures   MEDICATIONS ORDERED IN ED: Medications  ondansetron (ZOFRAN) tablet 4 mg (has no administration in time range)    Or  ondansetron (ZOFRAN) injection 4 mg (has no administration in time range)  acetaminophen (TYLENOL) tablet 650 mg (has no administration in time range)    Or  acetaminophen (TYLENOL) suppository 650 mg (has no administration in time range)  insulin glargine-yfgn (SEMGLEE) injection 20 Units (has no administration in time range)  insulin aspart (novoLOG) injection 8 Units (has no administration in time range)  insulin aspart (novoLOG) injection 0-20 Units (0 Units Subcutaneous Hold 09/16/23 1204)  insulin aspart (novoLOG) injection 0-5 Units (has no administration in time range)  carvedilol (COREG) tablet 3.125 mg (has no administration in time range)  sodium chloride 0.9 % bolus 1,000 mL (1,000 mLs Intravenous New Bag/Given 09/16/23 1232)  insulin aspart (novoLOG) injection 10 Units (10 Units Subcutaneous Given 09/16/23 1235)  sodium chloride 0.9 % bolus 1,000 mL (1,000 mLs Intravenous New Bag/Given 09/16/23 1352)     IMPRESSION / MDM / ASSESSMENT AND PLAN / ED COURSE  I reviewed the triage vital signs and the nursing notes.   Differential diagnosis includes, but is not limited to, urinary tract infection, glucose urea, DKA.  Patient is awake and alert, tachycardic on arrival to 110 though normotensive.  Labs obtained in triage are revealing for a glucose of 781.  This is likely the etiology of her urinary frequency.  No  constitutional or infectious type symptoms.  Patient also has a creatinine of 1.54.  She has no previous labs to compare this to.  She has  normal bicarb, normal gap, this is not consistent with DKA.  I recommended that she be admitted to the hospitalist service for her new onset diabetes.  Patient is in agreement with this plan.  Patient was accepted by Dr. Chipper Herb.  Patient was discussed with Dr. Roxan Hockey who agrees with assessment and plan.   Patient's presentation is most consistent with acute presentation with potential threat to life or bodily function.    FINAL CLINICAL IMPRESSION(S) / ED DIAGNOSES   Final diagnoses:  Hyperglycemia     Rx / DC Orders   ED Discharge Orders     None        Note:  This document was prepared using Dragon voice recognition software and may include unintentional dictation errors.   Keturah Shavers 09/16/23 1420    Chesley Noon, MD 09/16/23 1436

## 2023-09-17 LAB — BASIC METABOLIC PANEL
Anion gap: 11 (ref 5–15)
BUN: 16 mg/dL (ref 6–20)
CO2: 18 mmol/L — ABNORMAL LOW (ref 22–32)
Calcium: 8.2 mg/dL — ABNORMAL LOW (ref 8.9–10.3)
Chloride: 102 mmol/L (ref 98–111)
Creatinine, Ser: 1.42 mg/dL — ABNORMAL HIGH (ref 0.44–1.00)
GFR, Estimated: 50 mL/min — ABNORMAL LOW (ref >60)
Glucose, Bld: 299 mg/dL — ABNORMAL HIGH (ref 70–99)
Potassium: 4.9 mmol/L (ref 3.5–5.1)
Sodium: 131 mmol/L — ABNORMAL LOW (ref 135–145)

## 2023-09-17 LAB — GLUCOSE, CAPILLARY
Glucose-Capillary: 293 mg/dL — ABNORMAL HIGH (ref 70–99)
Glucose-Capillary: 287 mg/dL — ABNORMAL HIGH (ref 70–99)
Glucose-Capillary: 217 mg/dL — ABNORMAL HIGH (ref 70–99)
Glucose-Capillary: 279 mg/dL — ABNORMAL HIGH (ref 70–99)

## 2023-09-17 MED ORDER — LIVING WELL WITH DIABETES BOOK
Freq: Once | Status: AC
  Filled 2023-09-17: qty 1

## 2023-09-17 MED ORDER — INSULIN STARTER KIT- PEN NEEDLES (ENGLISH)
1.0000 | Freq: Once | Status: AC
  Administered 2023-09-17: 1
  Filled 2023-09-17 (×3): qty 1

## 2023-09-17 MED ORDER — PNEUMOCOCCAL 20-VAL CONJ VACC 0.5 ML IM SUSY
0.5000 mL | PREFILLED_SYRINGE | INTRAMUSCULAR | Status: AC
  Administered 2023-09-18: 0.5 mL via INTRAMUSCULAR
  Filled 2023-09-17: qty 0.5

## 2023-09-17 NOTE — Plan of Care (Signed)

## 2023-09-17 NOTE — Inpatient Diabetes Management (Addendum)
Inpatient Diabetes Program Recommendations  AACE/ADA: New Consensus Statement on Inpatient Glycemic Control (2015)  Target Ranges:  Prepandial:   less than 140 mg/dL      Peak postprandial:   less than 180 mg/dL (1-2 hours)      Critically ill patients:  140 - 180 mg/dL   Lab Results  Component Value Date   GLUCAP 287 (H) 09/17/2023    Review of Glycemic Control  Diabetes history: No prior DM Dx Current orders for Inpatient glycemic control: Semglee 30 units, Novolog 10 units tid meal coverage, Novolog correction 0-20 tid, 0-5 units hs  Inpatient Diabetes Program Recommendations:   Noted patient admitted with new onset DM2. Attempted to call patient's room phone (DM coordinator on weekend call working remotely) and cell phone without answer and RN to check on phone. RN ordered living Well with Diabetes booklet along with insulin pen starter kit and dietician consult. Ordered consult for transition of care regarding no insurance and going home on insulin and needs glucose meter. If patient needs cheaper insulin regimen due to cost, please consider: -Novolin 70/30 insulin from [employer] (approximately $45 for 5 pens) 70/30 20 units bid = 28 basal, 12 units meal coverage 70/30 22 bid = 31 units basal, 13 units meal coverage Spoke with patient via phone (DM coordinator working remotely on W/E call) Spoke with pt about new diagnosis. Discussed A1C results with them and explained what an A1C is, basic pathophysiology of DM Type 2, basic home care, basic diabetes diet nutrition principles, importance of checking CBGs and maintaining good CBG control to prevent long-term and short-term complications. Reviewed signs and symptoms of hyperglycemia and hypoglycemia and how to treat hypoglycemia at home. Also reviewed blood sugar goals at home.  RNs to provide ongoing basic DM education at bedside with this patient.   Thank you, Deborah Meyers. Deborah Stjames, RN, MSN, CDCES  Diabetes Coordinator Inpatient Glycemic  Control Team Team Pager [phone] (8am-5pm) 09/17/2023 2:18 PM

## 2023-09-17 NOTE — Progress Notes (Addendum)
Transition of Care [facility]) - Inpatient Brief Assessment   Patient Details  Name: FAREEHA OKULA MRN: [id number] Date of Birth: 1991-09-05  Transition of [facility]) CM/SW Contact:    Bing Quarry, RN Phone Number: 09/17/2023, 3:14 PM   Clinical Narrative: 11/17: Patient admitted 11/16 for increase thirst, urination, ?Uti. Dx: New onset DM. No PCP or insurance. [facility]eds [facility] medication management and Diabetic Coordinator follow up/education. RN and Inpatient Diabetic Program Coordinator did speak graciously speak with patient today by phone providing discussion and education, and ordered Living Well with Diabetes booklet, insulin pen starter kit, and dietary consult.   DC plan: Discharge tomorrow pending further re-visiting education, return demonstrations, community rx medication assistance/supplies. Will also need glucometer/testing supplies at discharge. PCP options list and [facility] contact information in AVS.   Financial navigator sent [employer] email for referral assistance. [email or web address]  TOC to follow through discharge.   Gabriel Cirri MSN RN CM  Care Management Department.  [place]  [facility] Direct Dial: [phone] Main Office Phone: [phone] Weekends Only      Transition of Care Asessment: Insurance and Status: Selfpay Patient has primary care physician: No Home environment has been reviewed: From home Prior level of function:: Unknown at this time Prior/Current Home Services: No current home services Social Determinants of Health Reivew: SDOH reviewed no interventions necessary Readmission risk has been reviewed: Yes Transition of care needs: transition of care needs identified, TOC will continue to follow

## 2023-09-17 NOTE — Progress Notes (Signed)
PROGRESS NOTE    Deborah Meyers   KVQ:[id number] DOB: October 09, 1991  DOA: 09/16/2023 Date of Service: 09/17/23 which is hospital day 0  PCP: Pcp, No    HPI: Deborah Meyers is a 32 y.o. female with medical history significant of morbid obesity, HTN, presented with 7 days of feeling constantly thirsty, increased urination and fatigue.    Hospital course / significant events:  11/16: admitted to hospitalist for mild HHS.  11/17: Glc and other parameters improved overnight. TOC and DM coordinator suggest staying until tomorrow to provide meds/resources.    Consultants:  none  Procedures/Surgeries: none      ASSESSMENT & PLAN:   New diagnosis diabetes Mild HHS - resolved Metabolic syndrome A1C pending, expect will be high  Long discussion w/ patient on DM dx/Rx  Basal + SSI here, plan transition to 70/30 20 or 22 units per day on discharge d/t cost (no insurance) metformin once kidney function stabilized. Check C-peptide and proinsulin/insulin ratio Outpatient endocrinology follow-up   AKI vs CKD AKI d/t dehydration more likely Monitor BMP   PCOS Diagnosis w/ hirsutism + irregular periods, supported by metabolic syndrome  Consider OCP w/ drosperinone + estrogen  Metformin will also be helpful with this  Weight loss    HTN Start Coreg for now DM2 preventive care would include ACE/ARB consdier this is renal function normalizes    Morbid obesity BMI> 60 calorie control recommended.     DVT prophylaxis: ambulation IV fluids: no continuous IV fluids  Nutrition: carb modified diet Central lines / invasive devices: non  Code Status: FULL CODE ACP documentation reviewed:  none on file in [facility]  TOC needs: PCP, meds Barriers to dispo / significant pending items: none medically             Subjective / Brief ROS:  Patient reports doing well this morning, some menstrual cramping but otherwise no cnocerns Denies CP/SOB.  Pain controlled.   Denies new weakness.  Tolerating diet .  Reports no concerns w/ urination/defecation.   Family Communication: none at this time     Objective Findings:  Vitals:   09/16/23 2100 09/16/23 2246 09/17/23 0324 09/17/23 0738  BP: (!) 148/116 (!) 152/107 132/84 (!) 154/92  Pulse: (!) 102 84 75 73  Resp:  17 20 18   Temp:  97.9 F (36.6 C) 98.5 F (36.9 C) 97.8 F (36.6 C)  TempSrc:  Oral  Oral  SpO2: 100%  100% 100%  Weight:  (!) 157.2 kg    Height:  5\' 2"  (1.575 m)      Intake/Output Summary (Last 24 hours) at 09/17/2023 1506 Last data filed at 09/17/2023 1148 Gross per 24 hour  Intake 120 ml  Output --  Net 120 ml   Filed Weights   09/16/23 2246  Weight: (!) 157.2 kg    Examination:  Physical Exam Constitutional:      General: She is not in acute distress.    Appearance: She is obese.  HENT:     Head:     Comments: Hirsutism Cardiovascular:     Rate and Rhythm: Normal rate and regular rhythm.  Pulmonary:     Effort: Pulmonary effort is normal.     Breath sounds: Normal breath sounds.  Musculoskeletal:     Right lower leg: No edema.     Left lower leg: No edema.  Skin:    General: Skin is warm and dry.  Neurological:     General: No focal deficit present.  Mental Status: She is alert and oriented to person, place, and time.  Psychiatric:        Mood and Affect: Mood normal.        Behavior: Behavior normal.          Scheduled Medications:   carvedilol  6.25 mg Oral BID WC   insulin aspart  0-20 Units Subcutaneous TID WC   insulin aspart  0-5 Units Subcutaneous QHS   insulin aspart  10 Units Subcutaneous TID WC   insulin glargine-yfgn  30 Units Subcutaneous Daily    Continuous Infusions:   PRN Medications:  acetaminophen **OR** acetaminophen, ondansetron **OR** ondansetron (ZOFRAN) IV  Antimicrobials from admission:  Anti-infectives (From admission, onward)    None           Data Reviewed:  I have personally reviewed the  following...  CBC: Recent Labs  Lab 09/16/23 0928  WBC 9.5  HGB 11.3*  HCT 39.2  MCV 71.4*  PLT 394   Basic Metabolic Panel: Recent Labs  Lab 09/16/23 0928 09/16/23 1621 09/17/23 0831  NA 131*  --  131*  K 4.2  --  4.9  CL 97*  --  102  CO2 23  --  18*  GLUCOSE 781* 502* 299*  BUN 14  --  16  CREATININE 1.58*  --  1.42*  CALCIUM 8.5*  --  8.2*   GFR: Estimated Creatinine Clearance: 81.9 mL/min (A) (by C-G formula based on SCr of 1.42 mg/dL (H)). Liver Function Tests: No results for input(s): "AST", "ALT", "ALKPHOS", "BILITOT", "PROT", "ALBUMIN" in the last 168 hours. No results for input(s): "LIPASE", "AMYLASE" in the last 168 hours. No results for input(s): "AMMONIA" in the last 168 hours. Coagulation Profile: No results for input(s): "INR", "PROTIME" in the last 168 hours. Cardiac Enzymes: No results for input(s): "CKTOTAL", "CKMB", "CKMBINDEX", "TROPONINI" in the last 168 hours. BNP (last 3 results) No results for input(s): "PROBNP" in the last 8760 hours. HbA1C: No results for input(s): "HGBA1C" in the last 72 hours. CBG: Recent Labs  Lab 09/16/23 1259 09/16/23 1612 09/16/23 2106 09/17/23 0738 09/17/23 1147  GLUCAP >600* 493* 191* 293* 287*   Lipid Profile: Recent Labs    09/16/23 0928  CHOL 158  HDL 27*  LDLCALC 86  TRIG 607*  CHOLHDL 5.9   Thyroid Function Tests: No results for input(s): "TSH", "T4TOTAL", "FREET4", "T3FREE", "THYROIDAB" in the last 72 hours. Anemia Panel: No results for input(s): "VITAMINB12", "FOLATE", "FERRITIN", "TIBC", "IRON", "RETICCTPCT" in the last 72 hours. Most Recent Urinalysis On File:     Component Value Date/Time   COLORURINE YELLOW (A) 09/16/2023 0917   APPEARANCEUR HAZY (A) 09/16/2023 0917   LABSPEC 1.028 09/16/2023 0917   PHURINE 6.0 09/16/2023 0917   GLUCOSEU >=500 (A) 09/16/2023 0917   HGBUR LARGE (A) 09/16/2023 0917   BILIRUBINUR NEGATIVE 09/16/2023 0917   KETONESUR NEGATIVE 09/16/2023 0917    PROTEINUR NEGATIVE 09/16/2023 0917   NITRITE NEGATIVE 09/16/2023 0917   LEUKOCYTESUR NEGATIVE 09/16/2023 0917   Sepsis Labs: @LABRCNTIP (procalcitonin:4,lacticidven:4) Microbiology: No results found for this or any previous visit (from the past 240 hour(s)).    Radiology Studies last 3 days: US RENAL  Result Date: 09/16/2023 CLINICAL DATA:  Acute kidney injury EXAM: RENAL / URINARY TRACT ULTRASOUND COMPLETE COMPARISON:  None Available. FINDINGS: Right Kidney: Length = 10.2 cm AP renal pelvis diameter = <10 mm Normal parenchymal echogenicity with preserved corticomedullary differentiation. No urinary tract dilation or shadowing calculi. The ureter is not seen. Left  Kidney: Length = 10.7 cm AP renal pelvis diameter = <10 mm Normal parenchymal echogenicity with preserved corticomedullary differentiation. No urinary tract dilation or shadowing calculi. The ureter is not seen. Bladder: Appears normal for degree of bladder distention. Other: Diffusely increased hepatic echogenicity can be seen in the setting of infiltrative process such as steatosis. IMPRESSION: 1. Technically challenging examination due to patient body habitus. No urinary tract dilation or shadowing calculi. Normal renal cortical echogenicity. 2. Diffusely increased hepatic echogenicity can be seen in the setting of infiltrative process such as steatosis. Electronically Signed   By: Agustin Meyers M.D.   On: 09/16/2023 14:15          Sunnie Nielsen, DO [facility] 09/17/2023, 3:06 PM    Dictation software may have been used to generate the above note. Typos may occur and escape review in typed/dictated notes. Please contact Dr Lyn Hollingshead directly for clarity if needed.  Staff may message me via secure chat in Epic  but this may not receive an immediate response,  please page me for urgent matters!  If 7PM-7AM, please contact night coverage [email or web address]

## 2023-09-18 ENCOUNTER — Other Ambulatory Visit: Payer: Self-pay

## 2023-09-18 DIAGNOSIS — E282 Polycystic ovarian syndrome: Secondary | ICD-10-CM | POA: Insufficient documentation

## 2023-09-18 DIAGNOSIS — R739 Hyperglycemia, unspecified: Secondary | ICD-10-CM

## 2023-09-18 DIAGNOSIS — I10 Essential (primary) hypertension: Secondary | ICD-10-CM | POA: Insufficient documentation

## 2023-09-18 DIAGNOSIS — N1831 Chronic kidney disease, stage 3a: Secondary | ICD-10-CM | POA: Insufficient documentation

## 2023-09-18 LAB — BASIC METABOLIC PANEL
Anion gap: 9 (ref 5–15)
BUN: 20 mg/dL (ref 6–20)
CO2: 22 mmol/L (ref 22–32)
Calcium: 8.3 mg/dL — ABNORMAL LOW (ref 8.9–10.3)
Chloride: 101 mmol/L (ref 98–111)
Creatinine, Ser: 1.5 mg/dL — ABNORMAL HIGH (ref 0.44–1.00)
GFR, Estimated: 47 mL/min — ABNORMAL LOW (ref >60)
Glucose, Bld: 317 mg/dL — ABNORMAL HIGH (ref 70–99)
Potassium: 3.5 mmol/L (ref 3.5–5.1)
Sodium: 132 mmol/L — ABNORMAL LOW (ref 135–145)

## 2023-09-18 LAB — HEMOGLOBIN A1C
Hgb A1c MFr Bld: 12.4 % — ABNORMAL HIGH (ref 4.8–5.6)
Mean Plasma Glucose: 309 mg/dL

## 2023-09-18 LAB — GLUCOSE, CAPILLARY
Glucose-Capillary: 370 mg/dL — ABNORMAL HIGH (ref 70–99)
Glucose-Capillary: 405 mg/dL — ABNORMAL HIGH (ref 70–99)

## 2023-09-18 MED ORDER — INSULIN ASPART PROT & ASPART (70-30 MIX) 100 UNIT/ML PEN: 20.0000 [IU] | PEN_INJECTOR | Freq: Two times a day (BID) | SUBCUTANEOUS | 1 refills | Status: DC

## 2023-09-18 MED ORDER — BLOOD GLUCOSE TEST VI STRP: 1.0000 | ORAL_STRIP | Freq: Three times a day (TID) | 0 refills | Status: AC

## 2023-09-18 MED ORDER — PEN NEEDLES 31G X 8 MM MISC: 1.0000 | Freq: Two times a day (BID) | 1 refills | Status: DC

## 2023-09-18 MED ORDER — LANCET DEVICE MISC: 1.0000 | Freq: Three times a day (TID) | 0 refills | Status: AC

## 2023-09-18 MED ORDER — BLOOD GLUCOSE TEST VI STRP: 1.0000 | ORAL_STRIP | Freq: Three times a day (TID) | 0 refills | Status: DC

## 2023-09-18 MED ORDER — LANCET DEVICE MISC: 1.0000 | Freq: Three times a day (TID) | 0 refills | Status: DC

## 2023-09-18 MED ORDER — LANCETS MISC. MISC: 1.0000 | 0 refills | Status: AC

## 2023-09-18 MED ORDER — INSULIN LISPRO PROT & LISPRO (75-25 MIX) 100 UNIT/ML KWIKPEN
30.0000 [IU] | PEN_INJECTOR | Freq: Two times a day (BID) | SUBCUTANEOUS | 1 refills | Status: DC
  Filled 2023-09-18: qty 15, 25d supply, fill #0

## 2023-09-18 MED ORDER — TRUEDRAW LANCING DEVICE MISC
1.0000 | Freq: Three times a day (TID) | 0 refills | Status: AC
  Filled 2023-09-18: qty 1, 30d supply, fill #0

## 2023-09-18 MED ORDER — BLOOD GLUCOSE MONITORING SUPPL DEVI: 1.0000 | Freq: Three times a day (TID) | 0 refills | Status: AC

## 2023-09-18 MED ORDER — PEN NEEDLES 31G X 8 MM MISC
1.0000 | Freq: Two times a day (BID) | 1 refills | Status: DC
  Filled 2023-09-18: qty 100, 50d supply, fill #0

## 2023-09-18 MED ORDER — LANCETS MISC. MISC: 1.0000 | Freq: Three times a day (TID) | 0 refills | Status: AC

## 2023-09-18 MED ORDER — BLOOD GLUCOSE MONITORING SUPPL DEVI: 1.0000 | Freq: Three times a day (TID) | 0 refills | Status: DC

## 2023-09-18 MED ORDER — BLOOD GLUCOSE MONITOR SYSTEM W/DEVICE KIT
1.0000 | PACK | Freq: Three times a day (TID) | 0 refills | Status: AC
  Filled 2023-09-18: qty 1, 1d supply, fill #0

## 2023-09-18 MED ORDER — AMLODIPINE BESYLATE 5 MG PO TABS: 5.0000 mg | ORAL_TABLET | Freq: Every day | ORAL | 0 refills | Status: DC

## 2023-09-18 MED ORDER — INSULIN LISPRO PROT & LISPRO (75-25 MIX) 100 UNIT/ML KWIKPEN
20.0000 [IU] | PEN_INJECTOR | Freq: Two times a day (BID) | SUBCUTANEOUS | 1 refills | Status: DC
  Filled 2023-09-18: qty 15, 38d supply, fill #0

## 2023-09-18 MED ORDER — BLOOD GLUCOSE TEST VI STRP
1.0000 | ORAL_STRIP | Freq: Three times a day (TID) | 0 refills | Status: AC
  Filled 2023-09-18: qty 100, 34d supply, fill #0

## 2023-09-18 MED ORDER — TRUEPLUS LANCETS 33G MISC
1 refills | Status: AC
  Filled 2023-09-18: qty 100, 30d supply, fill #0

## 2023-09-18 NOTE — Discharge Summary (Addendum)
Deborah Meyers ZOX:[id number] DOB: 1990/12/24 DOA: 09/16/2023  PCP: Pcp, No  Admit date: 09/16/2023 Discharge date: 09/18/2023  Time spent: 35 minutes  Recommendations for Outpatient Follow-up:  Establish with pcp  Attention to BP, kidney function, and glucose at f/u Also needs to address PCOS including endometrial cancer risk    Discharge Diagnoses:  Principal Problem:   Hyperglycemia Active Problems:   Uncontrolled type 2 diabetes mellitus with hyperglycemia, without long-term current use of insulin (HCC)   Morbid obesity (HCC)   CKD stage 3a, GFR 45-59 ml/min (HCC)   PCOS (polycystic ovarian syndrome)   Essential hypertension   Discharge Condition: stable  Diet recommendation: carb modified  Filed Weights   09/16/23 2246  Weight: (!) 157.2 kg    History of present illness:  From admission h and p Deborah Meyers is a 32 y.o. female with medical history significant of morbid obesity, HTN, presented with 7 days of feeling constantly thirsty, increased urination and fatigue.   Denied any pain no shortness breath no diarrhea no nauseous vomiting.    Hospital Course:  Patient presents with symptomatic hyperglycemia secondary to new uncontrolled type 2 diabetes. No hhs or dka. Also appears to have ckd 3a likely sequela of untreated DM, also appears to have hypertension and PCOS. Underlying severe obesity likely the primary cause of all these conditions. Patient does not receive health care and is uninsured. We started the patient on insulin and antihypertensives and her conditions improved. She is feeling well and ambulating independently. She declines my offer to call her father and review the situation. We will discharge her home on 75/25 (we can supply that free here) and amlodipine and she has been given a list of outpatient providers including a local sliding scale clinic. I have reinforced the importance of close f/u, warning her that if she continues to neglect  these medical problems they will only worsen. Given young age and severe obesity, we discussed that bariatric surgery is also something to strongly consider.   Procedures: none   Consultations: Dm educator  Discharge Exam: Vitals:   09/18/23 0322 09/18/23 0744  BP: 115/70 (!) 141/96  Pulse: 72 80  Resp: 20 16  Temp: 98 F (36.7 C) 97.6 F (36.4 C)  SpO2: 100% 100%    General: NAD Cardiovascular: RRR Respiratory: CTAB  Discharge Instructions   Discharge Instructions     Diet Carb Modified   Complete by: As directed    Increase activity slowly   Complete by: As directed       Allergies as of 09/18/2023   No Known Allergies      Medication List     STOP taking these medications    naproxen 375 MG tablet Commonly known as: NAPROSYN       TAKE these medications    amLODipine 5 MG tablet Commonly known as: NORVASC Take 1 tablet (5 mg total) by mouth daily.   Blood Glucose Monitoring Suppl Devi 1 each by Does not apply route in the morning, at noon, and at bedtime. May substitute to any manufacturer covered by patient's insurance.   True Metrix Meter w/Device Kit Test 3 times a day as directed   BLOOD GLUCOSE TEST STRIPS Strp 1 each by In Vitro route in the morning, at noon, and at bedtime. May substitute to any manufacturer covered by patient's insurance.   True Metrix Blood Glucose Test test strip Generic drug: glucose blood Test 3 (three) times daily before meals.   Insulin Lispro  Prot & Lispro (75-25) 100 UNIT/ML Kwikpen Commonly known as: HUMALOG 75/25 MIX Inject 30 Units into the skin 2 (two) times daily before a meal.   Insupen Pen Needles 31G X 8 MM Misc Generic drug: Insulin Pen Needle Use 2 (two) times daily before a meal with Humalog Mix 75/25   Lancet Device Misc 1 each by Does not apply route in the morning, at noon, and at bedtime. May substitute to any manufacturer covered by patient's insurance.   TRUEdraw Lancing Device  Misc Test 3 (three) times daily before meals.   Lancets Misc. Misc 1 each by Does not apply route as directed. May substitute to any manufacturer covered by patient's insurance.   Lancets Misc. Misc 1 each by Does not apply route in the morning, at noon, and at bedtime. May substitute to any manufacturer covered by patient's insurance.   TRUEplus Lancets 33G Misc test 3 times a day as directed        No Known Allergies  Follow-up Information     [facility] Follow up.   Why: This is a local clinic that accepts patients without health insurance Contact information: [address] [place] [address] [phone]                  The results of significant diagnostics from this hospitalization (including imaging, microbiology, ancillary and laboratory) are listed below for reference.    Significant Diagnostic Studies: US RENAL  Result Date: 09/16/2023 CLINICAL DATA:  Acute kidney injury EXAM: RENAL / URINARY TRACT ULTRASOUND COMPLETE COMPARISON:  None Available. FINDINGS: Right Kidney: Length = 10.2 cm AP renal pelvis diameter = <10 mm Normal parenchymal echogenicity with preserved corticomedullary differentiation. No urinary tract dilation or shadowing calculi. The ureter is not seen. Left Kidney: Length = 10.7 cm AP renal pelvis diameter = <10 mm Normal parenchymal echogenicity with preserved corticomedullary differentiation. No urinary tract dilation or shadowing calculi. The ureter is not seen. Bladder: Appears normal for degree of bladder distention. Other: Diffusely increased hepatic echogenicity can be seen in the setting of infiltrative process such as steatosis. IMPRESSION: 1. Technically challenging examination due to patient body habitus. No urinary tract dilation or shadowing calculi. Normal renal cortical echogenicity. 2. Diffusely increased hepatic echogenicity can be seen in the setting of infiltrative process such as steatosis.  Electronically Signed   By: Agustin Cree M.D.   On: 09/16/2023 14:15    Microbiology: No results found for this or any previous visit (from the past 240 hour(s)).   Labs: Basic Metabolic Panel: Recent Labs  Lab 09/16/23 0928 09/16/23 1621 09/17/23 0831 09/18/23 0452  NA 131*  --  131* 132*  K 4.2  --  4.9 3.5  CL 97*  --  102 101  CO2 23  --  18* 22  GLUCOSE 781* 502* 299* 317*  BUN 14  --  16 20  CREATININE 1.58*  --  1.42* 1.50*  CALCIUM 8.5*  --  8.2* 8.3*   Liver Function Tests: No results for input(s): "AST", "ALT", "ALKPHOS", "BILITOT", "PROT", "ALBUMIN" in the last 168 hours. No results for input(s): "LIPASE", "AMYLASE" in the last 168 hours. No results for input(s): "AMMONIA" in the last 168 hours. CBC: Recent Labs  Lab 09/16/23 0928  WBC 9.5  HGB 11.3*  HCT 39.2  MCV 71.4*  PLT 394   Cardiac Enzymes: No results for input(s): "CKTOTAL", "CKMB", "CKMBINDEX", "TROPONINI" in the last 168 hours. BNP: BNP (last 3 results) No results  for input(s): "BNP" in the last 8760 hours.  ProBNP (last 3 results) No results for input(s): "PROBNP" in the last 8760 hours.  CBG: Recent Labs  Lab 09/17/23 1147 09/17/23 1650 09/17/23 2116 09/18/23 0746 09/18/23 1222  GLUCAP 287* 217* 279* 370* 405*       Signed:  Silvano Bilis MD.  [facility] 09/18/2023, 1:29 PM

## 2023-09-18 NOTE — Plan of Care (Signed)
  Problem: Education: Goal: Ability to describe self-care measures that may prevent or decrease complications (Diabetes Survival Skills Education) will improve Outcome: Completed/Met Goal: Individualized Educational Video(s) Outcome: Completed/Met   Problem: Coping: Goal: Ability to adjust to condition or change in health will improve Outcome: Completed/Met   Problem: Fluid Volume: Goal: Ability to maintain a balanced intake and output will improve Outcome: Completed/Met   Problem: Health Behavior/Discharge Planning: Goal: Ability to identify and utilize available resources and services will improve Outcome: Completed/Met Goal: Ability to manage health-related needs will improve Outcome: Completed/Met   Problem: Metabolic: Goal: Ability to maintain appropriate glucose levels will improve Outcome: Completed/Met   Problem: Nutritional: Goal: Maintenance of adequate nutrition will improve Outcome: Completed/Met Goal: Progress toward achieving an optimal weight will improve Outcome: Completed/Met   Problem: Skin Integrity: Goal: Risk for impaired skin integrity will decrease Outcome: Completed/Met   Problem: Nutritional: Goal: Maintenance of adequate nutrition will improve Outcome: Completed/Met Goal: Progress toward achieving an optimal weight will improve Outcome: Completed/Met   Problem: Skin Integrity: Goal: Risk for impaired skin integrity will decrease Outcome: Completed/Met   Problem: Tissue Perfusion: Goal: Adequacy of tissue perfusion will improve Outcome: Completed/Met   Problem: Education: Goal: Knowledge of General Education information will improve Description: Including pain rating scale, medication(s)/side effects and non-pharmacologic comfort measures Outcome: Completed/Met   Problem: Health Behavior/Discharge Planning: Goal: Ability to manage health-related needs will improve Outcome: Completed/Met   Problem: Clinical Measurements: Goal: Ability  to maintain clinical measurements within normal limits will improve Outcome: Completed/Met Goal: Will remain free from infection Outcome: Completed/Met Goal: Diagnostic test results will improve Outcome: Completed/Met Goal: Respiratory complications will improve Outcome: Completed/Met Goal: Cardiovascular complication will be avoided Outcome: Completed/Met   Problem: Activity: Goal: Risk for activity intolerance will decrease Outcome: Completed/Met   Problem: Nutrition: Goal: Adequate nutrition will be maintained Outcome: Completed/Met   Problem: Coping: Goal: Level of anxiety will decrease Outcome: Completed/Met   Problem: Elimination: Goal: Will not experience complications related to bowel motility Outcome: Completed/Met Goal: Will not experience complications related to urinary retention Outcome: Completed/Met   Problem: Pain Management: Goal: General experience of comfort will improve Outcome: Completed/Met   Problem: Safety: Goal: Ability to remain free from injury will improve Outcome: Completed/Met   Problem: Skin Integrity: Goal: Risk for impaired skin integrity will decrease Outcome: Completed/Met

## 2023-09-18 NOTE — Progress Notes (Signed)
IV has been discontinued. The patient has discharged.

## 2023-09-18 NOTE — Inpatient Diabetes Management (Signed)
Inpatient Diabetes Program Recommendations  AACE/ADA: New Consensus Statement on Inpatient Glycemic Control (2015)  Target Ranges:  Prepandial:   less than 140 mg/dL      Peak postprandial:   less than 180 mg/dL (1-2 hours)      Critically ill patients:  140 - 180 mg/dL   Lab Results  Component Value Date   GLUCAP 370 (H) 09/18/2023   HGBA1C 12.4 (H) 09/16/2023   Inpatient Diabetes Program Recommendations:   Educated patient on insulin pen use at home. Reviewed contents of insulin flexpen starter kit. Reviewed all steps if insulin pen including attachment of needle, 2-unit air shot, dialing up dose, giving injection, removing needle, disposal of sharps, storage of unused insulin, disposal of insulin etc. Patient able to provide successful return demonstration. Also reviewed troubleshooting with insulin pen. MD to give patient Rxs for insulin pens and insulin pen needles.   Thank you, Billy Fischer. Matylda Fehring, RN, MSN, CDCES  Diabetes Coordinator Inpatient Glycemic Control Team Team Pager [phone] (8am-5pm) 09/18/2023 11:19 AM

## 2023-09-19 ENCOUNTER — Other Ambulatory Visit: Payer: Self-pay

## 2023-09-19 ENCOUNTER — Emergency Department
Admission: EM | Admit: 2023-09-19 | Discharge: 2023-09-19 | Discharge status: Against Medical Advice | Payer: Medicaid Other | Attending: Emergency Medicine | Admitting: Emergency Medicine

## 2023-09-19 DIAGNOSIS — Z5321 Procedure and treatment not carried out due to patient leaving prior to being seen by health care provider: Secondary | ICD-10-CM | POA: Diagnosis not present

## 2023-09-19 DIAGNOSIS — E119 Type 2 diabetes mellitus without complications: Secondary | ICD-10-CM | POA: Diagnosis present

## 2023-09-19 LAB — CBG MONITORING, ED: Glucose-Capillary: 301 mg/dL — ABNORMAL HIGH (ref 70–99)

## 2023-09-19 NOTE — ED Triage Notes (Signed)
Pt here asking for help with her diabetic supplies. Pt states she is not able to check her blood sugar due to not doing it correctly or she is having an issue with her test strips. Pt denies any medical issues today.

## 2023-09-19 NOTE — ED Notes (Signed)
Patient unsure how to stick finger with lancet. States she was unable to get blood from finger this morning.  Demonstrated how to use lancet. Patient showed understanding with return demonstration.  Discharged from hospital yesterday.  States wishes to leave without being seen.

## 2023-09-27 LAB — C-PEPTIDE: C-Peptide: 4.7 ng/mL — ABNORMAL HIGH (ref 1.1–4.4)

## 2023-09-30 LAB — PROINSULIN/INSULIN RATIO
Insulin: 7.1 u[IU]/mL
Proinsulin: 24.2 pmol/L — ABNORMAL HIGH

## 2023-10-16 ENCOUNTER — Other Ambulatory Visit: Payer: Self-pay

## 2023-12-05 ENCOUNTER — Other Ambulatory Visit: Payer: Self-pay

## 2023-12-05 ENCOUNTER — Emergency Department
Admission: EM | Admit: 2023-12-05 | Discharge: 2023-12-05 | Disposition: A | Discharge status: Home / Self Care | Payer: Medicaid Other | Attending: Emergency Medicine | Admitting: Emergency Medicine

## 2023-12-05 DIAGNOSIS — Z760 Encounter for issue of repeat prescription: Secondary | ICD-10-CM | POA: Insufficient documentation

## 2023-12-05 DIAGNOSIS — E1165 Type 2 diabetes mellitus with hyperglycemia: Secondary | ICD-10-CM | POA: Insufficient documentation

## 2023-12-05 LAB — CBG MONITORING, ED: Glucose-Capillary: 103 mg/dL — ABNORMAL HIGH (ref 70–99)

## 2023-12-05 MED ORDER — AMLODIPINE BESYLATE 5 MG PO TABS: 5.0000 mg | ORAL_TABLET | Freq: Every day | ORAL | 0 refills | Status: DC

## 2023-12-05 MED ORDER — INSULIN LISPRO PROT & LISPRO (75-25 MIX) 100 UNIT/ML KWIKPEN: 30.0000 [IU] | PEN_INJECTOR | Freq: Two times a day (BID) | SUBCUTANEOUS | 1 refills | Status: DC

## 2023-12-05 MED ORDER — PEN NEEDLES 31G X 8 MM MISC: 1.0000 | Freq: Two times a day (BID) | 1 refills | Status: DC

## 2023-12-05 NOTE — ED Triage Notes (Signed)
Pt comes with needing refill on her insulin humalog pen and amlodipine. Pt about to run out of meds.

## 2023-12-05 NOTE — ED Provider Notes (Signed)
 [facility] Provider Note    Event Date/Time   First MD Initiated Contact with Patient 12/05/23 1452     (approximate)   History   Medication Refill   HPI  Deborah Meyers is a 33 y.o. female with history of diabetes presents emergency department for a medication refill.  Patient states she tried to get in with her upcoming PCP appointment but was not able to.  States she told him she would come the ER to get medication refills that she does not want to be out of her insulin .  No chest pain, shortness of breath, vomiting or diarrhea.      Physical Exam   Triage Vital Signs: ED Triage Vitals  Encounter Vitals Group     BP 12/05/23 1303 (!) 169/115     Systolic BP Percentile --      Diastolic BP Percentile --      Pulse Rate 12/05/23 1303 (!) 107     Resp 12/05/23 1303 18     Temp 12/05/23 1303 98 F (36.7 C)     Temp src --      SpO2 12/05/23 1303 97 %     Weight 12/05/23 1300 (!) 340 lb (154.2 kg)     Height 12/05/23 1300 5' 2 (1.575 m)     Head Circumference --      Peak Flow --      Pain Score 12/05/23 1300 0     Pain Loc --      Pain Education --      Exclude from Growth Chart --     Most recent vital signs: Vitals:   12/05/23 1303  BP: (!) 169/115  Pulse: (!) 107  Resp: 18  Temp: 98 F (36.7 C)  SpO2: 97%     General: Awake, no distress.   CV:  Good peripheral perfusion. regular rate and  rhythm Resp:  Normal effort.  Abd:  No distention.   Other:      ED Results / Procedures / Treatments   Labs (all labs ordered are listed, but only abnormal results are displayed) Labs Reviewed  CBG MONITORING, ED - Abnormal; Notable for the following components:      Result Value   Glucose-Capillary 103 (*)    All other components within normal limits     EKG     RADIOLOGY     PROCEDURES:   Procedures Chief Complaint  Patient presents with   Medication Refill      MEDICATIONS ORDERED IN ED: Medications  - No data to display   IMPRESSION / MDM / ASSESSMENT AND PLAN / ED COURSE  I reviewed the triage vital signs and the nursing notes.                              Differential diagnosis includes, but is not limited to, diabetes, med refill,  Patient's presentation is most consistent with acute, uncomplicated illness.   Patient's medications were refilled.  She is in stable condition.  Discharged stable condition.      FINAL CLINICAL IMPRESSION(S) / ED DIAGNOSES   Final diagnoses:  Medication refill     Rx / DC Orders   ED Discharge Orders          Ordered    Insulin  Lispro Prot & Lispro (HUMALOG  75/25 MIX) (75-25) 100 UNIT/ML Kwikpen  2 times daily before meals  12/05/23 1459    amLODipine  (NORVASC ) 5 MG tablet  Daily        12/05/23 1459    Insulin  Pen Needle (PEN NEEDLES) 31G X 8 MM MISC  2 times daily before meals        12/05/23 1459             Note:  This document was prepared using Dragon voice recognition software and may include unintentional dictation errors.    Gasper Devere ORN, PA-C 12/05/23 [name], MD 12/11/23 [phone]

## 2024-01-12 ENCOUNTER — Encounter: Payer: Self-pay | Admitting: Family Medicine

## 2024-01-12 ENCOUNTER — Other Ambulatory Visit: Payer: Self-pay

## 2024-01-12 ENCOUNTER — Telehealth: Payer: Self-pay

## 2024-01-12 ENCOUNTER — Emergency Department
Admission: EM | Admit: 2024-01-12 | Discharge: 2024-01-12 | Disposition: A | Discharge status: Home / Self Care | Attending: Emergency Medicine | Admitting: Emergency Medicine

## 2024-01-12 ENCOUNTER — Emergency Department

## 2024-01-12 ENCOUNTER — Ambulatory Visit: Payer: Medicaid Other | Admitting: Family Medicine

## 2024-01-12 VITALS — BP 125/92 | HR 112 | Ht 62.0 in | Wt 347.9 lb

## 2024-01-12 DIAGNOSIS — M25562 Pain in left knee: Secondary | ICD-10-CM | POA: Insufficient documentation

## 2024-01-12 DIAGNOSIS — W19XXXA Unspecified fall, initial encounter: Secondary | ICD-10-CM | POA: Insufficient documentation

## 2024-01-12 DIAGNOSIS — E1165 Type 2 diabetes mellitus with hyperglycemia: Secondary | ICD-10-CM | POA: Insufficient documentation

## 2024-01-12 DIAGNOSIS — I10 Essential (primary) hypertension: Secondary | ICD-10-CM | POA: Insufficient documentation

## 2024-01-12 DIAGNOSIS — Z1321 Encounter for screening for nutritional disorder: Secondary | ICD-10-CM

## 2024-01-12 DIAGNOSIS — Z114 Encounter for screening for human immunodeficiency virus [HIV]: Secondary | ICD-10-CM

## 2024-01-12 DIAGNOSIS — R0989 Other specified symptoms and signs involving the circulatory and respiratory systems: Secondary | ICD-10-CM

## 2024-01-12 DIAGNOSIS — R55 Syncope and collapse: Secondary | ICD-10-CM | POA: Diagnosis not present

## 2024-01-12 DIAGNOSIS — Z794 Long term (current) use of insulin: Secondary | ICD-10-CM

## 2024-01-12 DIAGNOSIS — E1159 Type 2 diabetes mellitus with other circulatory complications: Secondary | ICD-10-CM | POA: Diagnosis not present

## 2024-01-12 DIAGNOSIS — I152 Hypertension secondary to endocrine disorders: Secondary | ICD-10-CM

## 2024-01-12 DIAGNOSIS — L680 Hirsutism: Secondary | ICD-10-CM

## 2024-01-12 DIAGNOSIS — Z1159 Encounter for screening for other viral diseases: Secondary | ICD-10-CM

## 2024-01-12 DIAGNOSIS — Z7689 Persons encountering health services in other specified circumstances: Secondary | ICD-10-CM | POA: Insufficient documentation

## 2024-01-12 DIAGNOSIS — E119 Type 2 diabetes mellitus without complications: Secondary | ICD-10-CM | POA: Insufficient documentation

## 2024-01-12 DIAGNOSIS — N926 Irregular menstruation, unspecified: Secondary | ICD-10-CM

## 2024-01-12 LAB — CBC
HCT: 36.7 % (ref 36.0–46.0)
Hemoglobin: 11 g/dL — ABNORMAL LOW (ref 12.0–15.0)
MCH: 21.8 pg — ABNORMAL LOW (ref 26.0–34.0)
MCHC: 30 g/dL (ref 30.0–36.0)
MCV: 72.8 fL — ABNORMAL LOW (ref 80.0–100.0)
Platelets: 283 10*3/uL (ref 150–400)
RBC: 5.04 MIL/uL (ref 3.87–5.11)
RDW: 18.7 % — ABNORMAL HIGH (ref 11.5–15.5)
WBC: 5.7 10*3/uL (ref 4.0–10.5)
nRBC: 0 % (ref 0.0–0.2)

## 2024-01-12 LAB — BASIC METABOLIC PANEL
Anion gap: 5 (ref 5–15)
BUN: 16 mg/dL (ref 6–20)
CO2: 24 mmol/L (ref 22–32)
Calcium: 7.9 mg/dL — ABNORMAL LOW (ref 8.9–10.3)
Chloride: 108 mmol/L (ref 98–111)
Creatinine, Ser: 1.65 mg/dL — ABNORMAL HIGH (ref 0.44–1.00)
GFR, Estimated: 42 mL/min — ABNORMAL LOW (ref >60)
Glucose, Bld: 124 mg/dL — ABNORMAL HIGH (ref 70–99)
Potassium: 3.8 mmol/L (ref 3.5–5.1)
Sodium: 137 mmol/L (ref 135–145)

## 2024-01-12 LAB — POC COVID19/FLU A&B COMBO
Covid Antigen, POC: NEGATIVE
Influenza A Antigen, POC: NEGATIVE
Influenza B Antigen, POC: NEGATIVE

## 2024-01-12 MED ORDER — AMLODIPINE BESYLATE 10 MG PO TABS: 10.0000 mg | ORAL_TABLET | Freq: Every day | ORAL | 1 refills | Status: DC

## 2024-01-12 MED ORDER — AMLODIPINE BESYLATE 5 MG PO TABS: 5.0000 mg | ORAL_TABLET | Freq: Every day | ORAL | 1 refills | Status: DC

## 2024-01-12 MED ORDER — METFORMIN HCL 500 MG PO TABS: 500.0000 mg | ORAL_TABLET | Freq: Two times a day (BID) | ORAL | 3 refills | Status: DC

## 2024-01-12 NOTE — Assessment & Plan Note (Addendum)
 Chronic BMI = 61.64 Complicated by HTN, DMII Recommend well balanced diet Increase weekly purposeful morning

## 2024-01-12 NOTE — Assessment & Plan Note (Signed)
 Elevated Newly diagnosed in February 2025 in ED visit Currently on Amlodipine 5mg  daily Had near syncope episode when checking out at clinic Pt may had been dehydrated during visit - recent viral illness Increase water intake Will hold on adjusting Emphasized home monitoring and dietary changes. F/u 4 weeks

## 2024-01-12 NOTE — ED Provider Notes (Signed)
 [facility] Provider Note    Event Date/Time   First MD Initiated Contact with Patient 01/12/24 1434     (approximate)   History   Knee Pain   HPI  Deborah Meyers is a 33 y.o. female with history of diabetes, morbid obesity, PCOS, and hypertension presents emergency department from her doctor's office.  Patient states she got up to leave and fell landing on left knee.  Was told to come here and have her knee checked.  Patient states she had a little hot and lightheaded prior to falling.  Denies chest pain, shortness of breath.  Dates she is feeling much better now.      Physical Exam   Triage Vital Signs: ED Triage Vitals [01/12/24 1202]  Encounter Vitals Group     BP (!) 134/99     Systolic BP Percentile      Diastolic BP Percentile      Pulse Rate (!) 108     Resp 18     Temp 99.1 F (37.3 C)     Temp src      SpO2 99 %     Weight (!) 337 lb (152.9 kg)     Height 5\' 2"  (1.575 m)     Head Circumference      Peak Flow      Pain Score 5     Pain Loc      Pain Education      Exclude from Growth Chart     Most recent vital signs: Vitals:   01/12/24 1202  BP: (!) 134/99  Pulse: (!) 108  Resp: 18  Temp: 99.1 F (37.3 C)  SpO2: 99%     General: Awake, no distress.   CV:  Good peripheral perfusion. regular rate and  rhythm Resp:  Normal effort. Abd:  No distention.   Other:  Left knee tender to palpation, full range of motion   ED Results / Procedures / Treatments   Labs (all labs ordered are listed, but only abnormal results are displayed) Labs Reviewed  BASIC METABOLIC PANEL - Abnormal; Notable for the following components:      Result Value   Glucose, Bld 124 (*)    Creatinine, Ser 1.65 (*)    Calcium 7.9 (*)    GFR, Estimated 42 (*)    All other components within normal limits  CBC - Abnormal; Notable for the following components:   Hemoglobin 11.0 (*)    MCV 72.8 (*)    MCH 21.8 (*)    RDW 18.7 (*)    All other  components within normal limits  URINALYSIS, ROUTINE W REFLEX MICROSCOPIC  POC URINE PREG, ED     EKG     RADIOLOGY X-ray of the left knee    PROCEDURES:   Procedures Chief Complaint  Patient presents with   Knee Pain      MEDICATIONS ORDERED IN ED: Medications - No data to display   IMPRESSION / MDM / ASSESSMENT AND PLAN / ED COURSE  I reviewed the triage vital signs and the nursing notes.                              Differential diagnosis includes, but is not limited to, syncope, near syncope, fall, contusion, fracture, sprain  Patient's presentation is most consistent with acute illness / injury with system symptoms.   X-ray of the left knee independently reviewed interpreted  by me as being negative for any acute abnormality   Labs are reassuring although the patient's calcium is low, did instruct her to take more calcium over-the-counter, return to her doctor's office for recheck next week.  I did discuss the low calcium Meyers with her and did offer for her to stay and get calcium here.  Patient is refusing at this time and would like to go home.  She was discharged stable condition.   FINAL CLINICAL IMPRESSION(S) / ED DIAGNOSES   Final diagnoses:  Acute pain of left knee  Fall, initial encounter     Rx / DC Orders   ED Discharge Orders     None        Note:  This document was prepared using Dragon voice recognition software and may include unintentional dictation errors.    Faythe Ghee, PA-C 01/12/24 1558    Sharman Cheek, MD 01/14/24 [phone]

## 2024-01-12 NOTE — Assessment & Plan Note (Signed)
 Irregular periods and facial hair suggest need for PCOS w/u.  Discussed metformin benefits and need for hormonal management.  Referral to OB/GYN recommended. - Order labs to evaluate hormone levels. - Refer to OB/GYN for further evaluation and management.

## 2024-01-12 NOTE — Assessment & Plan Note (Addendum)
 Diagnosed in November 2024  per pt States intermittently checks blood glucose with reports of oor glucose control with levels around 300 mg/dL.  Diet does not appear to be diabetic in nature Associated with HTN and morbid obesity Discussed checking daily fasting levels before insulin injected, goal <130 Discussed diet  -  well balanced diet smaller portions with increase protein, fruits, veggies, water as drink of choice, decrease starches, processed foods, and saturated fats.  Plan: - continue humalog 75/25 30 units BID - Check A1c level. - Start metformin 500mg  BID. - Refer to pharmacist for medication management. - Refer to nutritionist for dietary management. - Consider CGM if insurance covers. - Advise annual diabetic eye screening. - Check for neuropathy and foot health in follow-up visit.

## 2024-01-12 NOTE — Assessment & Plan Note (Signed)
 Symptoms started Tuesday Flu and COVID negative Afebrile Congestion, rhinorrhea Recommend symptomatic mgmt OTC mucinex/Dayquil/ Nyquil ok Hot tea with honey Throat lozenges Increase water intake - pt appears she is dehydration given near syncopal event

## 2024-01-12 NOTE — Assessment & Plan Note (Addendum)
 Concerns: DMII, HTN, PCOS work up Routine labs ordered - needs to get them through ED as phlebotomist could not retrieve at clinic  F/u 4 weeks Referral to pharm Referral to OB/GYN Referral to Nutrition

## 2024-01-12 NOTE — Progress Notes (Signed)
 Patient went to get labs but got stuck 4 times and couldn't give blood. Pt advised to go to ED for labs. When going to check out patient had slowly started to fall and hit her leg when reaching the ground. Pt was given water and cold wet rag while trying to get a BP on her. Pt stated she felt light-headed and dizzy. Lowest BP was 70's/50's. Finally got BP up to 125/92. Advised pt to go to ED to make sure everything was alright. Provider was notified when event took place and came to assess the situation until the pt was well enough to go to the ED

## 2024-01-12 NOTE — ED Notes (Signed)
 Pt in room and asking for graham crackers. Informed her EDP should see her first.

## 2024-01-12 NOTE — ED Triage Notes (Signed)
 Pt comes with left knee pain after falling. Pt states she went to a new doctor for visit to day and was not feeling good. Pt was checked for flu and it was negative. Pt was then leaving and ended up lightheaded and falling.   Pt denies hitting head.

## 2024-01-12 NOTE — Assessment & Plan Note (Addendum)
 Plan and assessment completed with patient Pt sent to Lab to retrieve blood draw Phlebotomist unable to get labs - recommended to go to ED  Pt sent to check out with front desk, where witnessed near syncopal episode  Pt was seen slowly "falling"/"sitting" to [employer] employee called for help, CMAs presented and assisted pt. Pt never lost consciousness, but did hit L knee BP initally 70s/50s, with monitoring came back to 125/92 - pt states she felt lightheaded BG = 114 with check by CMA Pt A&Ox4, vital stabilized No pain to left knee palpation or movement No longer symptomatic after monitoring and given glasses of water Appears to be Dehydration vs vasovagal  Pt's sister came up to clinic Provider recommend ED visit for closer eval and monitoring given near syncope Instructed to also get labs that were ordered for pt during ED visit. Sister and pt agreeable - [facility] ED triage called with handoff of episode Pt was wheelchaired to sister's car.

## 2024-01-12 NOTE — ED Notes (Signed)
 Lab called and Deborah Meyers on the way to come and collect samples.

## 2024-01-12 NOTE — Progress Notes (Signed)
 Care Guide Pharmacy Note  01/12/2024 Name: [name] MRN: [id number] DOB: 24-Nov-1990  Referred By: Sallee Provencal, FNP Reason for referral: Care Coordination (Outreach to schedule with Pharm d )   Deborah Meyers is a 33 y.o. year old female who is a primary care patient of Sallee Provencal, FNP.  Carlena Hurl was referred to the pharmacist for assistance related to: DMII  Successful contact was made with the patient to discuss pharmacy services including being ready for the pharmacist to call at least 5 minutes before the scheduled appointment time and to have medication bottles and any blood pressure readings ready for review. The patient agreed to meet with the pharmacist via telephone visit on (date/time).01/16/2024  Penne Lash , RMA     [place]  [facility], [facility] Guide  Direct Dial: [phone]  Website: [email or web address]

## 2024-01-12 NOTE — Assessment & Plan Note (Signed)
 Chronic Pt was told she has PCOS Will shave face Will check PCOS labs today Started on metformin 500mg  BID given DMII Referral to OB/GYN

## 2024-01-12 NOTE — Progress Notes (Signed)
 New Patient Office Visit  Introduced to nurse practitioner role and practice setting.  All questions answered.  Discussed provider/patient relationship and expectations.   Subjective    Patient ID: Deborah Meyers, female    DOB: 13-Nov-1990  Age: 33 y.o. MRN: [id number]  CC:  Chief Complaint  Patient presents with   New Patient (Initial Visit)    HPI Deborah Meyers presents to establish care with primary provider. PMH of PCOS, hypertension and type 2 diabetes who presents for a new patient visit.  She has a history of type 2 diabetes, diagnosed in November 2025. She is currently on Humalog 75/25, taking 30 units twice a day before meals. She checks her blood sugar using finger pokes but has difficulty obtaining sufficient blood for accurate readings. Her blood sugar levels have been around 300s mg/dL. She has not been on any other diabetes medications.  She has a history of hypertension and is currently taking amlodipine 5 mg daily. She has been on this medication for about 90 days. Her previous blood pressure readings were 169/115 mmHg and 143/111 mmHg. She does not monitor her blood pressure at home. Denies headaches, vision changes, or urination issues.  She experiences irregular menstrual cycles, with her last period occurring at the end of February and lasting about a week. Her periods are heavy, and she has been told she has polycystic ovary syndrome (PCOS) due to the presence of facial hair, which she shaves.  She has not been feeling well for the past week, with symptoms starting on Tuesday. She has been experiencing a slight headache that persists despite taking Nyquil Severe Cold and Flu. Her brother and sister were diagnosed with a respiratory illness this week, which raises her concern about her own symptoms.  No alcohol, tobacco, or substance use. She lives with her father, brother, and sister. She is not currently employed and enjoys listening to R&B music and watching  true crime shows.  Outpatient Encounter Medications as of 01/12/2024  Medication Sig   amLODipine (NORVASC) 5 MG tablet Take 1 tablet (5 mg total) by mouth daily.   Blood Glucose Monitoring Suppl (BLOOD GLUCOSE MONITOR SYSTEM) w/Device KIT Test 3 times a day as directed   Blood Glucose Monitoring Suppl DEVI 1 each by Does not apply route in the morning, at noon, and at bedtime. May substitute to any manufacturer covered by patient's insurance.   Insulin Lispro Prot & Lispro (HUMALOG 75/25 MIX) (75-25) 100 UNIT/ML Kwikpen Inject 30 Units into the skin 2 (two) times daily before a meal.   Insulin Pen Needle (PEN NEEDLES) 31G X 8 MM MISC Use 2 (two) times daily before a meal with Humalog Mix 75/25   metFORMIN (GLUCOPHAGE) 500 MG tablet Take 1 tablet (500 mg total) by mouth 2 (two) times daily with a meal.   TRUEplus Lancets 33G MISC test 3 times a day as directed   [DISCONTINUED] amLODipine (NORVASC) 10 MG tablet Take 1 tablet (10 mg total) by mouth daily.   [DISCONTINUED] amLODipine (NORVASC) 5 MG tablet Take 1 tablet (5 mg total) by mouth daily.   No facility-administered encounter medications on file as of 01/12/2024.    Past Medical History:  Diagnosis Date   Diabetes mellitus without complication (HCC)    Hypertension    History reviewed. No pertinent surgical history.  Family History  Problem Relation Age of Onset   Breast cancer Mother    Diabetes Father    Asthma Brother    Breast cancer  Maternal Aunt    Thyroid disease Paternal Aunt    Brain cancer Maternal Grandfather    Breast cancer Paternal Grandmother     Social History   Socioeconomic History   Marital status: Single    Spouse name: Not on file   Number of children: Not on file   Years of education: Not on file   Highest education level: Not on file  Occupational History   Not on file  Tobacco Use   Smoking status: Never   Smokeless tobacco: Never  Vaping Use   Vaping status: Never Used  Substance and  Sexual Activity   Alcohol use: Never   Drug use: Never   Sexual activity: Not on file  Other Topics Concern   Not on file  Social History Narrative   Not on file   Social Drivers of Health   Financial Resource Strain: Not on file  Food Insecurity: No Food Insecurity (09/16/2023)   Hunger Vital Sign    Worried About Running Out of Food in the Last Year: Never true    Ran Out of Food in the Last Year: Never true  Transportation Needs: No Transportation Needs (09/16/2023)   PRAPARE - Administrator, Civil Service (Medical): No    Lack of Transportation (Non-Medical): No  Physical Activity: Not on file  Stress: Not on file  Social Connections: Not on file  Intimate Partner Violence: Not At Risk (09/16/2023)   Humiliation, Afraid, Rape, and Kick questionnaire    Fear of Current or Ex-Partner: No    Emotionally Abused: No    Physically Abused: No    Sexually Abused: No   . Flowsheet Row Office Visit from 01/12/2024 in [facility]  PHQ-9 Total Score 6         01/12/2024    9:59 AM  GAD 7 : Generalized Anxiety Score  Nervous, Anxious, on Edge 0  Control/stop worrying 0  Worry too much - different things 0  Trouble relaxing 0  Restless 0  Easily annoyed or irritable 0  Afraid - awful might happen 0  Total GAD 7 Score 0  Anxiety Difficulty Not difficult at all   Review of Systems  All other systems reviewed and are negative.       Objective    BP (!) 125/92   Pulse (!) 112   Ht 5\' 2"  (1.575 m)   Wt (!) 347 lb 14.4 oz (157.8 kg)   SpO2 98%   BMI 63.63 kg/m   Physical Exam Constitutional:      General: She is not in acute distress.    Appearance: She is obese. She is not toxic-appearing or diaphoretic.     Comments: Poor hygiene, not well-groomed, appears to have lack of general awareness of body care. Hair disheveled, unkempt. Clothes dirty.  HENT:     Head: Normocephalic.     Right Ear: Tympanic membrane normal.      Left Ear: Tympanic membrane normal.     Nose: Nose normal.     Mouth/Throat:     Mouth: Mucous membranes are moist.     Pharynx: Oropharynx is clear.  Eyes:     Extraocular Movements: Extraocular movements intact.     Pupils: Pupils are equal, round, and reactive to light.  Cardiovascular:     Rate and Rhythm: Normal rate and regular rhythm.     Pulses: Normal pulses.     Heart sounds: Normal heart sounds. No murmur heard.  No friction rub. No gallop.  Pulmonary:     Effort: Pulmonary effort is normal. No respiratory distress.     Breath sounds: Normal breath sounds. No stridor. No wheezing, rhonchi or rales.  Chest:     Chest wall: No tenderness.  Musculoskeletal:     Right lower leg: No edema.     Left lower leg: No edema.  Skin:    General: Skin is warm and dry.     Capillary Refill: Capillary refill takes less than 2 seconds.     Comments: Facial hair present  Neurological:     General: No focal deficit present.     Mental Status: She is alert and oriented to person, place, and time. Mental status is at baseline.  Psychiatric:        Attention and Perception: Attention normal.        Mood and Affect: Mood normal. Affect is flat.        Speech: Speech is delayed.        Behavior: Behavior is slowed and withdrawn.        Thought Content: Thought content normal.        Judgment: Judgment normal.         Assessment & Plan:   Symptoms of upper respiratory infection (URI) Assessment & Plan: Symptoms started Tuesday Flu and COVID negative Afebrile Congestion, rhinorrhea Recommend symptomatic mgmt OTC mucinex/Dayquil/ Nyquil ok Hot tea with honey Throat lozenges Increase water intake - pt appears she is dehydration given near syncopal event   Orders: -     POC Covid19/Flu A&B Antigen  Type 2 diabetes mellitus with hyperglycemia, without long-term current use of insulin (HCC) Assessment & Plan: Diagnosed in November 2024  per pt States intermittently checks  blood glucose with reports of oor glucose control with levels around 300 mg/dL.  Diet does not appear to be diabetic in nature Associated with HTN and morbid obesity Discussed checking daily fasting levels before insulin injected, goal <130 Discussed diet  -  well balanced diet smaller portions with increase protein, fruits, veggies, water as drink of choice, decrease starches, processed foods, and saturated fats.  Plan: - continue humalog 75/25 30 units BID - Check A1c level. - Start metformin 500mg  BID. - Refer to pharmacist for medication management. - Refer to nutritionist for dietary management. - Consider CGM if insurance covers. - Advise annual diabetic eye screening. - Check for neuropathy and foot health in follow-up visit.  Orders: -     Lipid panel -     Microalbumin / creatinine urine ratio -     Hemoglobin A1c -     AMB Referral VBCI Care Management -     Referral to Nutrition and Diabetes Services -     metFORMIN HCl; Take 1 tablet (500 mg total) by mouth 2 (two) times daily with a meal.  Dispense: 180 tablet; Refill: 3  Hypertension associated with diabetes [facility]) Assessment & Plan: Elevated Newly diagnosed in February 2025 in ED visit Currently on Amlodipine 5mg  daily Had near syncope episode when checking out at clinic Pt may had been dehydrated during visit - recent viral illness Increase water intake Will hold on adjusting Emphasized home monitoring and dietary changes. F/u 4 weeks  Orders: -     Comprehensive metabolic panel -     CBC -     amLODIPine Besylate; Take 1 tablet (5 mg total) by mouth daily.  Dispense: 90 tablet; Refill: 1  Near syncope Assessment & Plan:  Plan and assessment completed with patient Pt sent to Lab to retrieve blood draw Phlebotomist unable to get labs - recommended to go to ED  Pt sent to check out with front desk, where witnessed near syncopal episode  Pt was seen slowly "falling"/"sitting" to [employer] employee  called for help, CMAs presented and assisted pt. Pt never lost consciousness, but did hit L knee BP initally 70s/50s, with monitoring came back to 125/92 - pt states she felt lightheaded BG = 114 with check by CMA Pt A&Ox4, vital stabilized No pain to left knee palpation or movement No longer symptomatic after monitoring and given glasses of water Appears to be Dehydration vs vasovagal  Pt's sister came up to clinic Provider recommend ED visit for closer eval and monitoring given near syncope Instructed to also get labs that were ordered for pt during ED visit. Sister and pt agreeable - [facility] ED triage called with handoff of episode Pt was wheelchaired to sister's car.   Hirsutism Assessment & Plan: Chronic Pt was told she has PCOS Will shave face Will check PCOS labs today Started on metformin 500mg  BID given DMII Referral to OB/GYN  Orders: -     Testosterone -     17-Hydroxyprogesterone -     Estrogens, total -     FSH/LH -     Progesterone -     Ambulatory referral to Obstetrics / Gynecology  Morbid obesity [facility]) Assessment & Plan: Chronic BMI = 61.64 Complicated by HTN, DMII Recommend well balanced diet Increase weekly purposeful morning   Orders: -     TSH -     Comprehensive metabolic panel  Abnormal menstrual periods Assessment & Plan: Irregular periods and facial hair suggest need for PCOS w/u.  Discussed metformin benefits and need for hormonal management.  Referral to OB/GYN recommended. - Order labs to evaluate hormone levels. - Refer to OB/GYN for further evaluation and management.  Orders: -     Testosterone -     17-Hydroxyprogesterone -     Estrogens, total -     FSH/LH -     Progesterone -     Ambulatory referral to Obstetrics / Gynecology  Screening for HIV (human immunodeficiency virus) -     HIV Antibody (routine testing w rflx)  Encounter for hepatitis C screening test for low risk patient -     Hepatitis C antibody  Encounter for  vitamin deficiency screening -     Vitamin B12 -     VITAMIN D 25 Hydroxy (Vit-D Deficiency, Fractures)  Encounter to establish care with new doctor Assessment & Plan: Concerns: DMII, HTN, PCOS work up Routine labs ordered - needs to get them through ED as phlebotomist could not retrieve at clinic  F/u 4 weeks Referral to pharm Referral to OB/GYN Referral to Nutrition    Return in about 4 weeks (around 02/09/2024) for BP and DMII check.   I, Sallee Provencal, FNP, have reviewed all documentation for this visit. The documentation on 01/12/24 for the exam, diagnosis, procedures, and orders are all accurate and complete.   Sallee Provencal, FNP

## 2024-01-15 ENCOUNTER — Telehealth: Payer: Self-pay

## 2024-01-15 NOTE — Telephone Encounter (Signed)
-----   Message from Sallee Provencal sent at 01/12/2024  5:07 PM EDT ----- Regarding: labs Call patient needs labs drawn via ED

## 2024-01-15 NOTE — Telephone Encounter (Signed)
 Tried to contact patient to advise of getting labs done at ED. Pt started talking and then hung up the phone. Ok to advise to get labs done at ED when able to get there

## 2024-01-16 ENCOUNTER — Telehealth: Payer: Self-pay | Admitting: Pharmacist

## 2024-01-16 ENCOUNTER — Other Ambulatory Visit: Payer: Self-pay | Admitting: Pharmacist

## 2024-01-16 NOTE — Progress Notes (Signed)
   01/16/2024  Patient ID: Deborah Meyers, female   DOB: November 03, 1990, 33 y.o.   MRN: [id number]  Tried calling both numbers today for scheduled call to review HTN and DM control. Unable to leave voicemail on either option. Will try again in 1 week.   Ricka Burdock, PharmD [facility]  Phone Number: [phone]

## 2024-01-23 ENCOUNTER — Telehealth: Payer: Self-pay | Admitting: Pharmacist

## 2024-01-23 ENCOUNTER — Telehealth: Payer: Self-pay

## 2024-01-23 NOTE — Progress Notes (Signed)
 Complex Care Management Care Guide Note  01/23/2024 Name: Deborah Meyers MRN: [id number] DOB: [birth year]  [name] is a 33 y.o. year old female who is a primary care patient of Sallee Provencal, FNP and is actively engaged with the care management team. I reached out to Carlena Hurl by phone today to assist with re-scheduling  with the Pharmacist.  Follow up plan: Unsuccessful telephone outreach attempt made. A HIPAA compliant phone message was left for the patient providing contact information and requesting a return call.  Penne Lash , RMA     [facility]  [facility] Guide  Direct Dial: [phone]  Website: [email or web address]

## 2024-01-23 NOTE — Progress Notes (Signed)
   01/23/2024  Patient ID: Carlena Hurl, female   DOB: Mar 28, 1991, 33 y.o.   MRN: [id number]  Tried calling both numbers today for scheduled call to review HTN and DM control. Unable to leave voicemail on either option. Will try again in 1 week.  2nd attempt   Ricka Burdock, PharmD [facility]  Phone Number: [phone]

## 2024-01-26 ENCOUNTER — Other Ambulatory Visit: Payer: Self-pay | Admitting: Family Medicine

## 2024-01-26 NOTE — Telephone Encounter (Signed)
 Copied from CRM [id number]. Topic: Clinical - Medication Refill >> Jan 26, 2024  9:23 AM Fredrich Romans wrote: Most Recent Primary Care Visit:  Provider: Charlcie Cradle A  Department: [facility]  Visit Type: NEW PATIENT  Date: 01/12/2024  Medication: Insulin Lispro Prot & Lispro (HUMALOG 75/25 MIX) (75-25) 100 UNIT/ML Kwikpen  Insulin Pen Needle (PEN NEEDLES) 31G X 8 MM MISC     Has the patient contacted their pharmacy? No (Agent: If no, request that the patient contact the pharmacy for the refill. If patient does not wish to contact the pharmacy document the reason why and proceed with request.) (Agent: If yes, when and what did the pharmacy advise?)  Is this the correct pharmacy for this prescription? Yes If no, delete pharmacy and type the correct one.  This is the patient's preferred pharmacy:   [facility] [address]), [place] - [address] [place]) [place] [address] Phone: [phone] Fax: [phone]   Has the prescription been filled recently? Yes  Is the patient out of the medication? No(almost)  Has the patient been seen for an appointment in the last year OR does the patient have an upcoming appointment? Yes  Can we respond through MyChart? No  Agent: Please be advised that Rx refills may take up to 3 business days. We ask that you follow-up with your pharmacy.

## 2024-01-29 ENCOUNTER — Encounter: Payer: Self-pay | Admitting: Obstetrics and Gynecology

## 2024-01-29 ENCOUNTER — Other Ambulatory Visit: Payer: Self-pay

## 2024-01-29 MED ORDER — PEN NEEDLES 31G X 8 MM MISC
1.0000 | Freq: Two times a day (BID) | 1 refills | Status: DC
  Filled 2024-01-29: qty 100, 50d supply, fill #0

## 2024-01-29 MED ORDER — INSULIN LISPRO PROT & LISPRO (75-25 MIX) 100 UNIT/ML KWIKPEN
30.0000 [IU] | PEN_INJECTOR | Freq: Two times a day (BID) | SUBCUTANEOUS | 1 refills | Status: DC
  Filled 2024-01-29: qty 15, 25d supply, fill #0

## 2024-01-29 NOTE — Telephone Encounter (Signed)
 Requested Prescriptions  Pending Prescriptions Disp Refills   Insulin Lispro Prot & Lispro (HUMALOG 75/25 MIX) (75-25) 100 UNIT/ML Kwikpen 15 mL 1    Sig: Inject 30 Units into the skin 2 (two) times daily before a meal.     Endocrinology:  Diabetes - Insulins Failed - 01/29/2024 11:58 AM      Failed - HBA1C is between 0 and 7.9 and within 180 days    Hgb A1c MFr Bld  Date Value Ref Range Status  09/16/2023 12.4 (H) 4.8 - 5.6 % Final    Comment:    (NOTE)         Prediabetes: 5.7 - 6.4         Diabetes: >6.4         Glycemic control for adults with diabetes: <7.0          Passed - Valid encounter within last 6 months    Recent Outpatient Visits           2 weeks ago Symptoms of upper respiratory infection (URI)   [place] [facility] Beverly Peckham, Jennette Kettle, FNP       Future Appointments             In 3 weeks Simmons-Robinson, Makiera, MD [facility]             Insulin Pen Needle (PEN NEEDLES) 31G X 8 MM MISC 100 each 1    Sig: Use 2 (two) times daily before a meal with Humalog Mix 75/25     Endocrinology: Diabetes - Testing Supplies Passed - 01/29/2024 11:58 AM      Passed - Valid encounter within last 12 months    Recent Outpatient Visits           2 weeks ago Symptoms of upper respiratory infection (URI)   [place] [facility] Ferrer Comun[place], Jennette Kettle, FNP       Future Appointments             In 3 weeks Simmons-Robinson, Tawanna Cooler, MD [facility], [facility]

## 2024-01-31 ENCOUNTER — Telehealth: Payer: Self-pay

## 2024-01-31 NOTE — Telephone Encounter (Signed)
 Copied from CRM [id number]. Topic: Clinical - Prescription Issue >> Jan 30, 2024 10:37 AM Deborah Meyers wrote: Reason for CRM: Pt called in because medicaitons:  Insulin Pen Needle (PEN NEEDLES) 31G X 8 MM MISC   Insulin Lispro Prot & Lispro (HUMALOG 75/25 MIX) (75-25) 100 UNIT/ML Kwikpen  Were both sent to pharmacy on 03/31 but [facility]. PT would like a callback to see when she can go get medication [phone]

## 2024-02-01 ENCOUNTER — Ambulatory Visit: Payer: Self-pay

## 2024-02-01 ENCOUNTER — Other Ambulatory Visit: Payer: Self-pay

## 2024-02-01 MED ORDER — INSULIN LISPRO PROT & LISPRO (75-25 MIX) 100 UNIT/ML KWIKPEN: 30.0000 [IU] | PEN_INJECTOR | Freq: Two times a day (BID) | SUBCUTANEOUS | 1 refills | Status: DC

## 2024-02-01 MED ORDER — PEN NEEDLES 31G X 8 MM MISC: 1.0000 | Freq: Two times a day (BID) | 1 refills | Status: DC

## 2024-02-01 NOTE — Addendum Note (Signed)
 Addended by: Liz Beach on: 02/01/2024 04:15 PM   Modules accepted: Orders

## 2024-02-01 NOTE — Telephone Encounter (Signed)
 Copied from CRM [id number]. Topic: Clinical - Prescription Issue >> Feb 01, 2024  9:31 AM Franchot Heidelberg wrote: Reason for CRM:   Copied from CRM [id number]. Topic: Clinical - Prescription Issue >> Jan 30, 2024 10:37 AM Ivette P wrote: Reason for CRM: Pt called in because medicaitons:   Insulin Pen Needle (PEN NEEDLES) 31G X 8 MM MISC     Insulin Lispro Prot & Lispro (HUMALOG 75/25 MIX) (75-25) 100 UNIT/ML Kwikpen   Were both sent to pharmacy on 03/31 but [facility]. PT would like a callback to see when she can go get medication [phone] Reason for Disposition  Health Information question, no triage required and triager able to answer question  Answer Assessment - Initial Assessment Questions 1. REASON FOR CALL or QUESTION: "What is your reason for calling today?" or "How can I best help you?" or "What question do you have that I can help answer?"     Pt called upset stating she is completely out of insulin/humalog, stated she called in the medication in ample enough time for it to be ready, stated she used last of insulin this morning.  Nurse informed patient - would check the status of the refill and give her a call back. Nurse disconnected call with patient and called [facility] pharmacy to check on status of refill.  Pharmacy informed that patient medication was ready for pickup with an out of pocket fee of $4.00.  Nurse called patient back and informed that medication was ready and provided her with the out of pocket fee.  Pt asked why did the Rx go there instead of [facility].  Informed patient that [facility] as a secondary pharmacy - patient requested to update [facility] to primary pharmacy and nurse did update the chart to reflect this change for future Rx.  Protocols used: Information Only Call - No Triage-A-AH

## 2024-02-01 NOTE — Telephone Encounter (Signed)
 Pt is picking up prescriptions from community pharmacy and [facility] to [facility] from [facility] from now on. Advised to request for prescription to be transferred while she is at pharmacy. Verbalized understanding.   [facility] pharmacy is now showing as primary and community pharmacy should be secondary

## 2024-02-19 NOTE — Progress Notes (Signed)
 Complex Care Management Care Guide Note  02/19/2024 Name: Deborah Meyers MRN: [id number] DOB: [birth year]  Deborah Meyers is a 33 y.o. year old female who is a primary care patient of Tasia Farr, FNP and is actively engaged with the care management team. I reached out to Verdel Gitelman by phone today to assist with re-scheduling  with the Pharmacist.  Follow up plan: Unsuccessful telephone outreach attempt made. A HIPAA compliant phone message was left for the patient providing contact information and requesting a return call.  Lenton Rail , RMA     [facility]  [facility] Guide  Direct Dial: [phone]  Website: [email or web address]

## 2024-02-21 ENCOUNTER — Ambulatory Visit: Admitting: Family Medicine

## 2024-02-21 NOTE — Progress Notes (Deleted)
      Established patient visit   Patient: Deborah Meyers   DOB: 06-May-1991   32 y.o. Female  MRN: [id number] Visit Date: 02/21/2024  Today's healthcare provider: Mimi Alt, MD   No chief complaint on file.  Subjective       Discussed the use of AI scribe software for clinical note transcription with the patient, who gave verbal consent to proceed.  History of Present Illness      Past Medical History:  Diagnosis Date   Diabetes mellitus without complication (HCC)    Hypertension     Medications: Outpatient Medications Prior to Visit  Medication Sig   amLODipine  (NORVASC ) 5 MG tablet Take 1 tablet (5 mg total) by mouth daily.   Blood Glucose Monitoring Suppl (BLOOD GLUCOSE MONITOR SYSTEM) w/Device KIT Test 3 times a day as directed   Blood Glucose Monitoring Suppl DEVI 1 each by Does not apply route in the morning, at noon, and at bedtime. May substitute to any manufacturer covered by patient's insurance.   Insulin  Lispro Prot & Lispro (HUMALOG  75/25 MIX) (75-25) 100 UNIT/ML Kwikpen Inject 30 Units into the skin 2 (two) times daily before a meal.   Insulin  Pen Needle (PEN NEEDLES) 31G X 8 MM MISC Use 2 (two) times daily before a meal with Humalog  Mix 75/25   metFORMIN  (GLUCOPHAGE ) 500 MG tablet Take 1 tablet (500 mg total) by mouth 2 (two) times daily with a meal.   TRUEplus Lancets 33G MISC test 3 times a day as directed   No facility-administered medications prior to visit.    Review of Systems  Last metabolic panel Lab Results  Component Value Date   GLUCOSE 124 (H) 01/12/2024   NA 137 01/12/2024   K 3.8 01/12/2024   CL 108 01/12/2024   CO2 24 01/12/2024   BUN 16 01/12/2024   CREATININE 1.65 (H) 01/12/2024   GFRNONAA 42 (L) 01/12/2024   CALCIUM 7.9 (L) 01/12/2024   ANIONGAP 5 01/12/2024   Last lipids Lab Results  Component Value Date   CHOL 158 09/16/2023   HDL 27 (L) 09/16/2023   LDLCALC 86 09/16/2023   TRIG 223 (H) 09/16/2023    CHOLHDL 5.9 09/16/2023   Last hemoglobin A1c Lab Results  Component Value Date   HGBA1C 12.4 (H) 09/16/2023     {See past labs  Heme  Chem  Endocrine  Serology  Results Review (optional):1}   Objective    There were no vitals taken for this visit. BP Readings from Last 3 Encounters:  01/12/24 (!) 140/105  01/12/24 (!) 125/92  12/05/23 (!) 169/115   Wt Readings from Last 3 Encounters:  01/12/24 (!) 337 lb (152.9 kg)  01/12/24 (!) 347 lb 14.4 oz (157.8 kg)  12/05/23 (!) 340 lb (154.2 kg)    {See vitals history (optional):1}    Physical Exam  ***  No results found for any visits on 02/21/24.  Assessment & Plan     Problem List Items Addressed This Visit       Cardiovascular and Mediastinum   Hypertension associated with diabetes (HCC) - Primary     Endocrine   Uncontrolled type 2 diabetes mellitus with hyperglycemia, without long-term current use of insulin  [facility])     Assessment & Plan      No follow-ups on file.         Mimi Alt, MD  [facility] [phone] (phone) [phone] (fax)  [facility]

## 2024-04-22 ENCOUNTER — Other Ambulatory Visit: Payer: Self-pay | Admitting: Family Medicine

## 2024-04-22 NOTE — Telephone Encounter (Unsigned)
 Copied from CRM [id number]. Topic: Clinical - Medication Refill >> Apr 22, 2024 10:55 AM Elle L wrote: Medication: Insulin  Lispro Prot & Lispro (HUMALOG  75/25 MIX) (75-25) 100 UNIT/ML Kwikpen  Has the patient contacted their pharmacy? Yes  This is the patient's preferred pharmacy:  [facility] [address] ([address]), [place] - [address] [place]) [place] [address] Phone: [phone] Fax: [phone]  Is this the correct pharmacy for this prescription? Yes  Has the prescription been filled recently? Yes  Is the patient out of the medication? Yes  Has the patient been seen for an appointment in the last year OR does the patient have an upcoming appointment? Yes  Can we respond through MyChart? Yes  Agent: Please be advised that Rx refills may take up to 3 business days. We ask that you follow-up with your pharmacy.

## 2024-04-24 MED ORDER — INSULIN LISPRO PROT & LISPRO (75-25 MIX) 100 UNIT/ML KWIKPEN: 30.0000 [IU] | PEN_INJECTOR | Freq: Two times a day (BID) | SUBCUTANEOUS | 1 refills | Status: DC

## 2024-04-24 NOTE — Telephone Encounter (Signed)
 Requested medication (s) are due for refill today: na  Requested medication (s) are on the active medication list: yes   Last refill:  02/01/24 #74ml 1 refills  Future visit scheduled: no   Notes to clinic:  protocol failed last labs 09/16/23 last OV 01/12/24. Patient requesting refills. Do you want to refill Rx?     Requested Prescriptions  Pending Prescriptions Disp Refills   Insulin  Lispro Prot & Lispro (HUMALOG  75/25 MIX) (75-25) 100 UNIT/ML Kwikpen 15 mL 1    Sig: Inject 30 Units into the skin 2 (two) times daily before a meal.     Endocrinology:  Diabetes - Insulins Failed - 04/24/2024  8:09 AM      Failed - HBA1C is between 0 and 7.9 and within 180 days    Hgb A1c MFr Bld  Date Value Ref Range Status  09/16/2023 12.4 (H) 4.8 - 5.6 % Final    Comment:    (NOTE)         Prediabetes: 5.7 - 6.4         Diabetes: >6.4         Glycemic control for adults with diabetes: <7.0          Passed - Valid encounter within last 6 months    Recent Outpatient Visits           3 months ago Symptoms of upper respiratory infection (URI)   [facility] [facility] [place], Curtis LABOR, [place]

## 2024-05-23 ENCOUNTER — Telehealth: Payer: Self-pay

## 2024-05-23 MED ORDER — INSULIN LISPRO PROT & LISPRO (75-25 MIX) 100 UNIT/ML KWIKPEN: 30.0000 [IU] | PEN_INJECTOR | Freq: Two times a day (BID) | SUBCUTANEOUS | 1 refills | Status: DC

## 2024-05-23 NOTE — Telephone Encounter (Signed)
 Copied from CRM [id number]. Topic: Clinical - Medication Prior Auth >> May 23, 2024 10:37 AM Jasmin G wrote: Reason for CRM: Pt needs authorization sent to [employer] for her Insulin  Lispro Prot & Lispro (HUMALOG  75/25 MIX) (75-25) 100 UNIT/ML refill so it can be approved within the next 24 hours. Please call pt back if needed.

## 2024-05-24 ENCOUNTER — Telehealth: Payer: Self-pay | Admitting: Pharmacy Technician

## 2024-05-24 ENCOUNTER — Other Ambulatory Visit (HOSPITAL_COMMUNITY): Payer: Self-pay

## 2024-05-24 NOTE — Telephone Encounter (Signed)
 PA request has been Received. New Encounter has been or will be created for follow up. For additional info see Pharmacy Prior Auth telephone encounter from 05/24/2024.

## 2024-05-24 NOTE — Telephone Encounter (Signed)
 Pharmacy Patient Advocate Encounter   Received notification from Pt Calls Messages that prior authorization for Insulin  Lispro Prot & Lispro 75-25 100 unit/ml Kwikpen is required/requested.   Insurance verification completed.   The patient is insured through [employer] .   Per test claim: The current 25 day co-pay is, $4.00.  No PA needed at this time. This test claim was processed through [facility]- copay amounts may vary at other pharmacies due to pharmacy/plan contracts, or as the patient moves through the different stages of their insurance plan.

## 2024-05-29 ENCOUNTER — Ambulatory Visit: Admitting: Family Medicine

## 2024-06-07 ENCOUNTER — Ambulatory Visit: Admitting: Family Medicine

## 2024-06-10 LAB — COMPREHENSIVE METABOLIC PANEL WITH GFR
ALT: 31 IU/L (ref 0–32)
AST: 21 IU/L (ref 0–40)
Albumin: 4.3 g/dL (ref 3.9–4.9)
Alkaline Phosphatase: 130 IU/L — ABNORMAL HIGH (ref 44–121)
BUN/Creatinine Ratio: 12 (ref 9–23)
BUN: 17 mg/dL (ref 6–20)
Bilirubin Total: 0.4 mg/dL (ref 0.0–1.2)
CO2: 19 mmol/L — ABNORMAL LOW (ref 20–29)
Calcium: 9.5 mg/dL (ref 8.7–10.2)
Chloride: 105 mmol/L (ref 96–106)
Creatinine, Ser: 1.37 mg/dL — ABNORMAL HIGH (ref 0.57–1.00)
Globulin, Total: 3.4 g/dL (ref 1.5–4.5)
Glucose: 97 mg/dL (ref 70–99)
Potassium: 4.7 mmol/L (ref 3.5–5.2)
Sodium: 140 mmol/L (ref 134–144)
Total Protein: 7.7 g/dL (ref 6.0–8.5)
eGFR: 52 mL/min/1.73 — ABNORMAL LOW (ref >59)

## 2024-06-10 LAB — CBC
Hematocrit: 42.8 % (ref 34.0–46.6)
Hemoglobin: 13.5 g/dL (ref 11.1–15.9)
MCH: 26.3 pg — ABNORMAL LOW (ref 26.6–33.0)
MCHC: 31.5 g/dL (ref 31.5–35.7)
MCV: 83 fL (ref 79–97)
Platelets: 354 x10E3/uL (ref 150–450)
RBC: 5.13 x10E6/uL (ref 3.77–5.28)
RDW: 15.8 % — ABNORMAL HIGH (ref 11.7–15.4)
WBC: 6.5 x10E3/uL (ref 3.4–10.8)

## 2024-06-10 LAB — HEMOGLOBIN A1C
Est. average glucose Bld gHb Est-mCnc: 120 mg/dL
Hgb A1c MFr Bld: 5.8 % — ABNORMAL HIGH (ref 4.8–5.6)

## 2024-06-10 LAB — HIV ANTIBODY (ROUTINE TESTING W REFLEX): HIV Screen 4th Generation wRfx: NONREACTIVE

## 2024-06-10 LAB — VITAMIN B12: Vitamin B-12: 289 pg/mL (ref 232–1245)

## 2024-06-10 LAB — TSH: TSH: 2.09 u[IU]/mL (ref 0.450–4.500)

## 2024-06-10 LAB — LIPID PANEL
Chol/HDL Ratio: 3.3 ratio (ref 0.0–4.4)
Cholesterol, Total: 154 mg/dL (ref 100–199)
HDL: 46 mg/dL (ref >39)
LDL Chol Calc (NIH): 89 mg/dL (ref 0–99)
Triglycerides: 104 mg/dL (ref 0–149)
VLDL Cholesterol Cal: 19 mg/dL (ref 5–40)

## 2024-06-10 LAB — TESTOSTERONE: Testosterone: 39 ng/dL (ref 8–60)

## 2024-06-10 LAB — MICROALBUMIN / CREATININE URINE RATIO
Creatinine, Urine: 247.3 mg/dL
Microalb/Creat Ratio: 19 mg/g{creat} (ref 0–29)
Microalbumin, Urine: 47.5 ug/mL

## 2024-06-10 LAB — HEPATITIS C ANTIBODY: Hep C Virus Ab: NONREACTIVE

## 2024-06-10 LAB — VITAMIN D 25 HYDROXY (VIT D DEFICIENCY, FRACTURES): Vit D, 25-Hydroxy: 5.2 ng/mL — ABNORMAL LOW (ref 30.0–100.0)

## 2024-06-10 LAB — ESTROGENS, TOTAL: Estrogen: 168 pg/mL

## 2024-06-10 LAB — FSH/LH
FSH: 6.4 m[IU]/mL
LH: 8.5 m[IU]/mL

## 2024-06-10 LAB — PROGESTERONE: Progesterone: 0.2 ng/mL

## 2024-06-10 LAB — 17-HYDROXYPROGESTERONE: 17-OH Progesterone LCMS: 38 ng/dL

## 2024-06-11 ENCOUNTER — Ambulatory Visit: Payer: Self-pay | Admitting: Family Medicine

## 2024-06-11 DIAGNOSIS — E559 Vitamin D deficiency, unspecified: Secondary | ICD-10-CM

## 2024-06-11 MED ORDER — VITAMIN D (ERGOCALCIFEROL) 1.25 MG (50000 UNIT) PO CAPS
50000.0000 [IU] | ORAL_CAPSULE | ORAL | 6 refills | Status: DC
Start: 2024-06-11 — End: 2024-09-06

## 2024-07-02 ENCOUNTER — Ambulatory Visit: Admitting: Family Medicine

## 2024-07-09 ENCOUNTER — Other Ambulatory Visit: Payer: Self-pay | Admitting: Family Medicine

## 2024-07-09 DIAGNOSIS — E1159 Type 2 diabetes mellitus with other circulatory complications: Secondary | ICD-10-CM

## 2024-07-09 NOTE — Telephone Encounter (Unsigned)
 Copied from CRM [id number]. Topic: Clinical - Medication Refill >> Jul 09, 2024 12:38 PM Tiffini S wrote: Medication: amLODipine  (NORVASC ) 10 MG tablet, Insulin  Lispro Prot & Lispro (HUMALOG  75/25 MIX) (75-25) 100 UNIT/ML Kwikpen,  Insulin  Pen Needle (PEN NEEDLES) 31G X 8 MM MISC    Has the patient contacted their pharmacy? No (Agent: If no, request that the patient contact the pharmacy for the refill. If patient does not wish to contact the pharmacy document the reason why and proceed with request.) (Agent: If yes, when and what did the pharmacy advise?)  This is the patient's preferred pharmacy:  [facility] [address] ([address]), [place] - [address] [place]) [place] [address] Phone: [phone] Fax: [phone]    Is this the correct pharmacy for this prescription? Yes If no, delete pharmacy and type the correct one.   Has the prescription been filled recently? Yes  Is the patient out of the medication? Yes, running low   Has the patient been seen for an appointment in the last year OR does the patient have an upcoming appointment? Yes  Can we respond through MyChart? Yes  Agent: Please be advised that Rx refills may take up to 3 business days. We ask that you follow-up with your pharmacy.

## 2024-07-10 MED ORDER — INSULIN LISPRO PROT & LISPRO (75-25 MIX) 100 UNIT/ML KWIKPEN: 30.0000 [IU] | PEN_INJECTOR | Freq: Two times a day (BID) | SUBCUTANEOUS | 0 refills | Status: DC

## 2024-07-10 MED ORDER — AMLODIPINE BESYLATE 5 MG PO TABS: 5.0000 mg | ORAL_TABLET | Freq: Every day | ORAL | 0 refills | Status: DC

## 2024-07-10 MED ORDER — PEN NEEDLES 31G X 8 MM MISC: 1.0000 | Freq: Two times a day (BID) | 0 refills | Status: DC

## 2024-07-10 NOTE — Telephone Encounter (Signed)
 Requested Prescriptions  Pending Prescriptions Disp Refills   amLODipine  (NORVASC ) 5 MG tablet 90 tablet 0    Sig: Take 1 tablet (5 mg total) by mouth daily.     Cardiovascular: Calcium Channel Blockers 2 Failed - 07/10/2024  3:55 PM      Failed - Last BP in normal range    BP Readings from Last 1 Encounters:  01/12/24 (!) 140/105         Passed - Last Heart Rate in normal range    Pulse Readings from Last 1 Encounters:  01/12/24 96         Passed - Valid encounter within last 6 months    Recent Outpatient Visits           6 months ago Symptoms of upper respiratory infection (URI)   [facility] [place], [place]ledge A, FNP               Insulin  Lispro Prot & Lispro (HUMALOG  75/25 MIX) (75-25) 100 UNIT/ML Kwikpen 15 mL 0    Sig: Inject 30 Units into the skin 2 (two) times daily before a meal.     Endocrinology:  Diabetes - Insulins Passed - 07/10/2024  3:55 PM      Passed - HBA1C is between 0 and 7.9 and within 180 days    Hgb A1c MFr Bld  Date Value Ref Range Status  06/07/2024 5.8 (H) 4.8 - 5.6 % Final    Comment:             Prediabetes: 5.7 - 6.4          Diabetes: >6.4          Glycemic control for adults with diabetes: <7.0          Passed - Valid encounter within last 6 months    Recent Outpatient Visits           6 months ago Symptoms of upper respiratory infection (URI)   [place] [facility] [place], [place] A, FNP               Insulin  Pen Needle (PEN NEEDLES) 31G X 8 MM MISC 100 each 0    Sig: Use 2 (two) times daily before a meal with Humalog  Mix 75/25     Endocrinology: Diabetes - Testing Supplies Passed - 07/10/2024  3:55 PM      Passed - Valid encounter within last 12 months    Recent Outpatient Visits           6 months ago Symptoms of upper respiratory infection (URI)   [facility] [place], Curtis LABOR, [place]

## 2024-07-11 ENCOUNTER — Other Ambulatory Visit (HOSPITAL_COMMUNITY): Payer: Self-pay

## 2024-07-12 NOTE — Telephone Encounter (Signed)
 Copied from CRM #[id number]. Topic: Clinical - Prescription Issue >> Jul 12, 2024 12:44 PM Carlatta H wrote: Reason for CRM: The patient is stating that her Insulin  Lispro Prot & Lispro (HUMALOG  75/25 MIX) (75-25) 100 UNIT/ML Kary [[id number]] wasn't filled by the pharmacy// She was just told to contact the doctors office//Please call the patient to advise

## 2024-07-12 NOTE — Telephone Encounter (Addendum)
 Looks like prescription was sent in 07/10/2024. Tried calling patient no answer.   Insulin  Lispro Prot & Lispro (HUMALOG  75/25 MIX) (75-25) 100 UNIT/ML Kwikpen 15 mL 0 07/10/2024 --   Sig - Route: Inject 30 Units into the skin 2 (two) times daily before a meal. - Subcutaneous   Sent to pharmacy as: Insulin  Lispro Prot & Lispro (HUMALOG  75/25 MIX) (75-25) 100 UNIT/ML Kwikpen   E-Prescribing Status: Receipt confirmed by pharmacy (07/10/2024  3:55 PM EDT)

## 2024-07-15 ENCOUNTER — Other Ambulatory Visit (HOSPITAL_COMMUNITY): Payer: Self-pay

## 2024-07-22 ENCOUNTER — Ambulatory Visit: Admitting: Family Medicine

## 2024-07-30 ENCOUNTER — Other Ambulatory Visit: Payer: Self-pay | Admitting: Family Medicine

## 2024-07-30 NOTE — Telephone Encounter (Unsigned)
 Copied from CRM [id number]. Topic: Clinical - Medication Refill >> Jul 30, 2024  1:21 PM Ameerah G wrote: Medication: Insulin  Lispro Prot & Lispro (HUMALOG  75/25 MIX) (75-25) 100 UNIT/ML Kary [[id number]]  Has the patient contacted their pharmacy? Yes (Agent: If no, request that the patient contact the pharmacy for the refill. If patient does not wish to contact the pharmacy document the reason why and proceed with request.) (Agent: If yes, when and what did the pharmacy advise?)  This is the patient's preferred pharmacy:  [facility] [address] (N), [place] - [address] [place]) [place] [address] Phone: [phone] Fax: [phone]  Is this the correct pharmacy for this prescription? Yes If no, delete pharmacy and type the correct one.   Has the prescription been filled recently? No  Is the patient out of the medication? No running low  Has the patient been seen for an appointment in the last year OR does the patient have an upcoming appointment? No  Can we respond through MyChart? Yes  Agent: Please be advised that Rx refills may take up to 3 business days. We ask that you follow-up with your pharmacy.

## 2024-08-01 MED ORDER — INSULIN LISPRO PROT & LISPRO (75-25 MIX) 100 UNIT/ML KWIKPEN: 30.0000 [IU] | PEN_INJECTOR | Freq: Two times a day (BID) | SUBCUTANEOUS | 0 refills | Status: DC

## 2024-08-01 NOTE — Telephone Encounter (Signed)
 Requested medication (s) are due for refill today: yes  Requested medication (s) are on the active medication list: ys  Last refill:  07/10/24  Future visit scheduled: no  Notes to clinic:  Unable to refill per protocol, courtesy refill already given, routing for provider approval.      Requested Prescriptions  Pending Prescriptions Disp Refills   Insulin  Lispro Prot & Lispro (HUMALOG  75/25 MIX) (75-25) 100 UNIT/ML Kwikpen 15 mL 0    Sig: Inject 30 Units into the skin 2 (two) times daily before a meal.     Endocrinology:  Diabetes - Insulins Failed - 08/01/2024 11:31 AM      Failed - Valid encounter within last 6 months    Recent Outpatient Visits           6 months ago Symptoms of upper respiratory infection (URI)   [place] [facility] [place], Rea A, FNP              Passed - HBA1C is between 0 and 7.9 and within 180 days    Hgb A1c MFr Bld  Date Value Ref Range Status  06/07/2024 5.8 (H) 4.8 - 5.6 % Final    Comment:             Prediabetes: 5.7 - 6.4          Diabetes: >6.4          Glycemic control for adults with diabetes: <7.0

## 2024-08-27 ENCOUNTER — Other Ambulatory Visit: Payer: Self-pay | Admitting: Family Medicine

## 2024-08-27 DIAGNOSIS — E559 Vitamin D deficiency, unspecified: Secondary | ICD-10-CM

## 2024-08-27 NOTE — Telephone Encounter (Unsigned)
 Copied from CRM #[id number]. Topic: Clinical - Medication Refill >> Aug 27, 2024 10:49 AM Kendralyn S wrote: Medication: Vitamin D , Ergocalciferol , (DRISDOL ) 1.25 MG (50000 UNIT) CAPS capsule  Insulin  Pen Needle (PEN NEEDLES) 31G X 8 MM MISC Insulin  Lispro Prot & Lispro (HUMALOG  75/25 MIX) (75-25) 100 UNIT/ML Kwikpen   Has the patient contacted their pharmacy? No (Agent: If no, request that the patient contact the pharmacy for the refill. If patient does not wish to contact the pharmacy document the reason why and proceed with request.) (Agent: If yes, when and what did the pharmacy advise?)  This is the patient's preferred pharmacy:  [facility] [address] ([address]), [place] - [address] [place]) [place] [address] Phone: [phone] Fax: [phone]  Is this the correct pharmacy for this prescription? Yes If no, delete pharmacy and type the correct one.   Has the prescription been filled recently? No  Is the patient out of the medication? No  Has the patient been seen for an appointment in the last year OR does the patient have an upcoming appointment? Yes  Can we respond through MyChart? yes  Agent: Please be advised that Rx refills may take up to 3 business days. We ask that you follow-up with your pharmacy.

## 2024-08-29 NOTE — Telephone Encounter (Signed)
 Too soon for refill.  Requested Prescriptions  Pending Prescriptions Disp Refills   Vitamin D , Ergocalciferol , (DRISDOL ) 1.25 MG (50000 UNIT) CAPS capsule 5 capsule 6    Sig: Take 1 capsule (50,000 Units total) by mouth every 7 (seven) days.     Endocrinology:  Vitamins - Vitamin D  Supplementation 2 Failed - 08/29/2024 10:43 AM      Failed - Manual Review: Route requests for 50,000 IU strength to the provider      Failed - Vitamin D  in normal range and within 360 days    Vit D, 25-Hydroxy  Date Value Ref Range Status  06/07/2024 5.2 (L) 30.0 - 100.0 ng/mL Final    Comment:    Vitamin D  deficiency has been defined by the Institute of Medicine and an Endocrine Society practice guideline as a level of serum 25-OH vitamin D  less than 20 ng/mL (1,2). The Endocrine Society went on to further define vitamin D  insufficiency as a level between 21 and 29 ng/mL (2). 1. IOM (Institute of Medicine). 2010. Dietary reference    intakes for calcium and D. [place]: The    [employer]. 2. Holick MF, Binkley Greenfield, Bischoff-Ferrari HA, et al.    Evaluation, treatment, and prevention of vitamin D     deficiency: an Endocrine Society clinical practice    guideline. JCEM. 2011 Jul; 96(7):1911-30.          Passed - Ca in normal range and within 360 days    Calcium  Date Value Ref Range Status  06/07/2024 9.5 8.7 - 10.2 mg/dL Final         Passed - Valid encounter within last 12 months    Recent Outpatient Visits           7 months ago Symptoms of upper respiratory infection (URI)   [place] [facility] [place], [place] A, FNP               Insulin  Pen Needle (PEN NEEDLES) 31G X 8 MM MISC 100 each 0    Sig: Use 2 (two) times daily before a meal with Humalog  Mix 75/25     Endocrinology: Diabetes - Testing Supplies Passed - 08/29/2024 10:43 AM      Passed - Valid encounter within last 12 months    Recent Outpatient Visits           7 months ago  Symptoms of upper respiratory infection (URI)   [place] [facility] [place], Curtis A, FNP               Insulin  Lispro Prot & Lispro (HUMALOG  75/25 MIX) (75-25) 100 UNIT/ML Kwikpen 15 mL 0    Sig: Inject 30 Units into the skin 2 (two) times daily before a meal.     Endocrinology:  Diabetes - Insulins Failed - 08/29/2024 10:43 AM      Failed - Valid encounter within last 6 months    Recent Outpatient Visits           7 months ago Symptoms of upper respiratory infection (URI)   [place] [facility] [place], [place] A, FNP              Passed - HBA1C is between 0 and 7.9 and within 180 days    Hgb A1c MFr Bld  Date Value Ref Range Status  06/07/2024 5.8 (H) 4.8 - 5.6 % Final    Comment:  Prediabetes: 5.7 - 6.4          Diabetes: >6.4          Glycemic control for adults with diabetes: <7.0

## 2024-09-04 ENCOUNTER — Other Ambulatory Visit: Payer: Self-pay | Admitting: Family Medicine

## 2024-09-04 DIAGNOSIS — E559 Vitamin D deficiency, unspecified: Secondary | ICD-10-CM

## 2024-09-04 DIAGNOSIS — I152 Hypertension secondary to endocrine disorders: Secondary | ICD-10-CM

## 2024-09-04 NOTE — Telephone Encounter (Unsigned)
 Copied from CRM [id number]. Topic: Clinical - Medication Refill >> Sep 04, 2024 11:44 AM Rosaria E wrote:  Medication: Vitamin D , Ergocalciferol , (DRISDOL ) 1.25 MG (50000 UNIT) CAPS capsule  Insulin  Pen Needle (PEN NEEDLES) 31G X 8 MM MISC Insulin  Lispro Prot & Lispro (HUMALOG  75/25 MIX) (75-25) 100 UNIT/ML Kwikpen   amLODipine  (NORVASC ) 5 MG tablet    Has the patient contacted their pharmacy? No (Agent: If no, request that the patient contact the pharmacy for the refill. If patient does not wish to contact the pharmacy document the reason why and proceed with request.) (Agent: If yes, when and what did the pharmacy advise?)   This is the patient's preferred pharmacy:  [facility] [address] ([address]), [place] - [address] [place]) [place] [address] Phone: [phone] Fax: [phone]   Is this the correct pharmacy for this prescription? Yes If no, delete pharmacy and type the correct one.    Has the prescription been filled recently? No   Is the patient out of the medication? No   Has the patient been seen for an appointment in the last year OR does the patient have an upcoming appointment? Yes   Can we respond through MyChart? yes   Agent: Please be advised that Rx refills may take up to 3 business days. We ask that you follow-up with your pharmacy.

## 2024-09-06 ENCOUNTER — Other Ambulatory Visit: Payer: Self-pay | Admitting: Family Medicine

## 2024-09-06 DIAGNOSIS — E559 Vitamin D deficiency, unspecified: Secondary | ICD-10-CM

## 2024-09-06 DIAGNOSIS — E1165 Type 2 diabetes mellitus with hyperglycemia: Secondary | ICD-10-CM

## 2024-09-06 DIAGNOSIS — I152 Hypertension secondary to endocrine disorders: Secondary | ICD-10-CM

## 2024-09-06 MED ORDER — INSULIN LISPRO PROT & LISPRO (75-25 MIX) 100 UNIT/ML KWIKPEN: 30.0000 [IU] | PEN_INJECTOR | Freq: Two times a day (BID) | SUBCUTANEOUS | 0 refills | Status: DC

## 2024-09-06 MED ORDER — AMLODIPINE BESYLATE 5 MG PO TABS: 5.0000 mg | ORAL_TABLET | Freq: Every day | ORAL | 0 refills | Status: DC

## 2024-09-06 MED ORDER — PEN NEEDLES 31G X 8 MM MISC: 1.0000 | Freq: Two times a day (BID) | 0 refills | Status: DC

## 2024-09-06 MED ORDER — VITAMIN D (ERGOCALCIFEROL) 1.25 MG (50000 UNIT) PO CAPS: 50000.0000 [IU] | ORAL_CAPSULE | ORAL | 0 refills | Status: DC

## 2024-09-06 NOTE — Telephone Encounter (Signed)
 Duplicate request,too soon for refill.  Requested Prescriptions  Pending Prescriptions Disp Refills   Vitamin D , Ergocalciferol , (DRISDOL ) 1.25 MG (50000 UNIT) CAPS capsule 5 capsule 6    Sig: Take 1 capsule (50,000 Units total) by mouth every 7 (seven) days.     Endocrinology:  Vitamins - Vitamin D  Supplementation 2 Failed - 09/06/2024  8:49 AM      Failed - Manual Review: Route requests for 50,000 IU strength to the provider      Failed - Vitamin D  in normal range and within 360 days    Vit D, 25-Hydroxy  Date Value Ref Range Status  06/07/2024 5.2 (L) 30.0 - 100.0 ng/mL Final    Comment:    Vitamin D  deficiency has been defined by the Institute of Medicine and an Endocrine Society practice guideline as a level of serum 25-OH vitamin D  less than 20 ng/mL (1,2). The Endocrine Society went on to further define vitamin D  insufficiency as a level between 21 and 29 ng/mL (2). 1. IOM (Institute of Medicine). 2010. Dietary reference    intakes for calcium and D. [place]: The    [employer]. 2. Holick MF, Binkley [place], Bischoff-Ferrari HA, et al.    Evaluation, treatment, and prevention of vitamin D     deficiency: an Endocrine Society clinical practice    guideline. JCEM. 2011 Jul; 96(7):1911-30.          Passed - Ca in normal range and within 360 days    Calcium  Date Value Ref Range Status  06/07/2024 9.5 8.7 - 10.2 mg/dL Final         Passed - Valid encounter within last 12 months    Recent Outpatient Visits           7 months ago Symptoms of upper respiratory infection (URI)   [facility] [place], [place] A, FNP               Insulin  Pen Needle (PEN NEEDLES) 31G X 8 MM MISC 100 each 0    Sig: Use 2 (two) times daily before a meal with Humalog  Mix 75/25     Endocrinology: Diabetes - Testing Supplies Passed - 09/06/2024  8:49 AM      Passed - Valid encounter within last 12 months    Recent Outpatient Visits           7  months ago Symptoms of upper respiratory infection (URI)   [place] [facility] [place], [place] A, FNP               Insulin  Lispro Prot & Lispro (HUMALOG  75/25 MIX) (75-25) 100 UNIT/ML Kwikpen 15 mL 0    Sig: Inject 30 Units into the skin 2 (two) times daily before a meal.     Endocrinology:  Diabetes - Insulins Failed - 09/06/2024  8:49 AM      Failed - Valid encounter within last 6 months    Recent Outpatient Visits           7 months ago Symptoms of upper respiratory infection (URI)   [place] [facility] [place], [place] A, FNP              Passed - HBA1C is between 0 and 7.9 and within 180 days    Hgb A1c MFr Bld  Date Value Ref Range Status  06/07/2024 5.8 (H) 4.8 - 5.6 % Final    Comment:  Prediabetes: 5.7 - 6.4          Diabetes: >6.4          Glycemic control for adults with diabetes: <7.0           amLODipine  (NORVASC ) 5 MG tablet 90 tablet 0    Sig: Take 1 tablet (5 mg total) by mouth daily.     Cardiovascular: Calcium Channel Blockers 2 Failed - 09/06/2024  8:49 AM      Failed - Last BP in normal range    BP Readings from Last 1 Encounters:  01/12/24 (!) 140/105         Failed - Valid encounter within last 6 months    Recent Outpatient Visits           7 months ago Symptoms of upper respiratory infection (URI)   [place] [facility] [place], [place] A, [place]              Passed - Last Heart Rate in normal range    Pulse Readings from Last 1 Encounters:  01/12/24 96

## 2024-09-06 NOTE — Telephone Encounter (Signed)
 Copied from CRM [id number]. Topic: Clinical - Prescription Issue >> Sep 06, 2024 11:58 AM Delon DASEN wrote: Reason for CRM: Patient still waiting for refills, she is at the pharmacy now.

## 2024-10-01 ENCOUNTER — Other Ambulatory Visit: Payer: Self-pay

## 2024-10-01 DIAGNOSIS — E1165 Type 2 diabetes mellitus with hyperglycemia: Secondary | ICD-10-CM

## 2024-10-01 NOTE — Telephone Encounter (Signed)
 Copied from CRM [id number]. Topic: Clinical - Medication Refill >> Oct 01, 2024  9:36 AM Myrick T wrote: Medication: Insulin  Lispro Prot & Lispro (HUMALOG  75/25 MIX) (75-25) 100 UNIT/ML Kwikpen  Has the patient contacted their pharmacy? No  This is the patient's preferred pharmacy:  [facility] [address] ([address]), [place] - [address] [place]) [place] [address] Phone: [phone] Fax: [phone]  Is this the correct pharmacy for this prescription? Yes  Has the prescription been filled recently? Yes  Is the patient out of the medication? No  Has the patient been seen for an appointment in the last year OR does the patient have an upcoming appointment? Yes  Can we respond through MyChart? Yes  Agent: Please be advised that Rx refills may take up to 3 business days. We ask that you follow-up with your pharmacy.

## 2024-10-03 NOTE — Telephone Encounter (Signed)
 Requested medications are due for refill today.  yes  Requested medications are on the active medications list.  yes  Last refill. 09/06/2024 15mL 0 rf  Future visit scheduled.   no  Notes to clinic.  Pt already given a courtesy refill.    Requested Prescriptions  Pending Prescriptions Disp Refills   Insulin  Lispro Prot & Lispro (HUMALOG  75/25 MIX) (75-25) 100 UNIT/ML Kwikpen 15 mL 0    Sig: Inject 30 Units into the skin 2 (two) times daily before a meal.     Endocrinology:  Diabetes - Insulins Failed - 10/03/2024  5:48 PM      Failed - Valid encounter within last 6 months    Recent Outpatient Visits           8 months ago Symptoms of upper respiratory infection (URI)   [facility] [place], [place] A, FNP              Passed - HBA1C is between 0 and 7.9 and within 180 days    Hgb A1c MFr Bld  Date Value Ref Range Status  06/07/2024 5.8 (H) 4.8 - 5.6 % Final    Comment:             Prediabetes: 5.7 - 6.4          Diabetes: >6.4          Glycemic control for adults with diabetes: <7.0

## 2024-10-04 ENCOUNTER — Telehealth: Payer: Self-pay | Admitting: Physician Assistant

## 2024-10-04 ENCOUNTER — Other Ambulatory Visit: Payer: Self-pay

## 2024-10-04 DIAGNOSIS — E1165 Type 2 diabetes mellitus with hyperglycemia: Secondary | ICD-10-CM

## 2024-10-04 NOTE — Telephone Encounter (Unsigned)
 Copied from CRM [id number]. Topic: Clinical - Prescription Issue >> Oct 04, 2024  2:07 PM Deborah Meyers wrote: Reason for CRM: Patient is calling for an update about medication refill  for Insulin  Lispro Prot & Lispro (HUMALOG  75/25 MIX) (75-25) 100 UNIT/ML Kwikpen,  amLODipine  (NORVASC ) 5 MG tablet and Vitamin D , Ergocalciferol , (DRISDOL ) 1.25 MG (50000 UNIT) CAPS capsule  States that she have one pen- asked if medication can be refilled until  appointment on 11/28/24- patient will be out of medication soon   [facility] [address]  [address] [place]) [place] [address] Phone: [phone]     Please call the patient back at [facility] [phone]

## 2024-10-04 NOTE — Telephone Encounter (Signed)
 Copied from CRM [id number]. Topic: Clinical - Medication Refill >> Oct 01, 2024  9:36 AM Myrick T wrote: Medication: Insulin  Lispro Prot & Lispro (HUMALOG  75/25 MIX) (75-25) 100 UNIT/ML Kwikpen  Has the patient contacted their pharmacy? No  This is the patient's preferred pharmacy:  [facility] [address] ([address]), [place] - [address] [place]) [place] [address] Phone: [phone] Fax: [phone]  Is this the correct pharmacy for this prescription? Yes  Has the prescription been filled recently? Yes  Is the patient out of the medication? No  Has the patient been seen for an appointment in the last year OR does the patient have an upcoming appointment? Yes  Can we respond through MyChart? Yes  Agent: Please be advised that Rx refills may take up to 3 business days. We ask that you follow-up with your pharmacy. >> Oct 04, 2024  1:40 PM Montie POUR wrote: Ms Prindiville made the first available appointment for 11/28/24. She needs above medication refilled by the end of this week or she will be out of medication. Please call her at [phone] with any concerns or questions. Thanks >> Oct 04, 2024  1:29 PM Montie POUR wrote: LELAN

## 2024-10-04 NOTE — Telephone Encounter (Signed)
 Requested medication (s) are due for refill today: Yes  Requested medication (s) are on the active medication list: Yes  Last refill:  09/06/24  Future visit scheduled: Yes  Notes to clinic:  Unable to refill per protocol, appointment needed. TOC appointment scheduled, courtesy already given.      Requested Prescriptions  Pending Prescriptions Disp Refills   Insulin  Lispro Prot & Lispro (HUMALOG  75/25 MIX) (75-25) 100 UNIT/ML Kwikpen 15 mL 0    Sig: Inject 30 Units into the skin 2 (two) times daily before a meal.     Endocrinology:  Diabetes - Insulins Failed - 10/04/2024  2:19 PM      Failed - Valid encounter within last 6 months    Recent Outpatient Visits           8 months ago Symptoms of upper respiratory infection (URI)   [facility] [place], [place] A, FNP              Passed - HBA1C is between 0 and 7.9 and within 180 days    Hgb A1c MFr Bld  Date Value Ref Range Status  06/07/2024 5.8 (H) 4.8 - 5.6 % Final    Comment:             Prediabetes: 5.7 - 6.4          Diabetes: >6.4          Glycemic control for adults with diabetes: <7.0

## 2024-10-07 ENCOUNTER — Other Ambulatory Visit: Payer: Self-pay

## 2024-10-07 DIAGNOSIS — I152 Hypertension secondary to endocrine disorders: Secondary | ICD-10-CM

## 2024-10-07 DIAGNOSIS — E559 Vitamin D deficiency, unspecified: Secondary | ICD-10-CM

## 2024-10-07 DIAGNOSIS — E1165 Type 2 diabetes mellitus with hyperglycemia: Secondary | ICD-10-CM

## 2024-10-07 MED ORDER — AMLODIPINE BESYLATE 5 MG PO TABS: 5.0000 mg | ORAL_TABLET | Freq: Every day | ORAL | 0 refills | Status: DC

## 2024-10-07 MED ORDER — VITAMIN D (ERGOCALCIFEROL) 1.25 MG (50000 UNIT) PO CAPS: 50000.0000 [IU] | ORAL_CAPSULE | ORAL | 0 refills | Status: DC

## 2024-10-07 MED ORDER — INSULIN LISPRO PROT & LISPRO (75-25 MIX) 100 UNIT/ML KWIKPEN: 30.0000 [IU] | PEN_INJECTOR | Freq: Two times a day (BID) | SUBCUTANEOUS | 0 refills | Status: DC

## 2024-10-07 NOTE — Telephone Encounter (Signed)
 Spoke with [facility] and advise medication has been sent to pharmacy. He verbalized understanding and will rely message to pt.

## 2024-11-04 ENCOUNTER — Other Ambulatory Visit: Payer: Self-pay | Admitting: Family Medicine

## 2024-11-04 DIAGNOSIS — E1165 Type 2 diabetes mellitus with hyperglycemia: Secondary | ICD-10-CM

## 2024-11-04 DIAGNOSIS — E559 Vitamin D deficiency, unspecified: Secondary | ICD-10-CM

## 2024-11-04 NOTE — Telephone Encounter (Signed)
 Copied from CRM #[id number]. Topic: Clinical - Medication Refill >> Nov 04, 2024  9:21 AM Everette C wrote: Medication: Insulin  Lispro Prot & Lispro (HUMALOG  75/25 MIX) (75-25) 100 UNIT/ML Kwikpen [[id number]]  Vitamin D , Ergocalciferol , (DRISDOL ) 1.25 MG (50000 UNIT) CAPS capsule [[id number]]  Insulin  Pen Needle (PEN NEEDLES) 31G X 8 MM MISC [[id number]]  Has the patient contacted their pharmacy? Yes (Agent: If no, request that the patient contact the pharmacy for the refill. If patient does not wish to contact the pharmacy document the reason why and proceed with request.) (Agent: If yes, when and what did the pharmacy advise?)  This is the patient's preferred pharmacy:  [facility] [address] ([address]), [place] - [address] [place]) [place] [address] Phone: [phone] Fax: [phone]  Is this the correct pharmacy for this prescription? Yes If no, delete pharmacy and type the correct one.   Has the prescription been filled recently? Yes  Is the patient out of the medication? No  Has the patient been seen for an appointment in the last year OR does the patient have an upcoming appointment? Yes  Can we respond through MyChart? No  Agent: Please be advised that Rx refills may take up to 3 business days. We ask that you follow-up with your pharmacy.

## 2024-11-05 MED ORDER — PEN NEEDLES 31G X 8 MM MISC: 1.0000 | Freq: Two times a day (BID) | 0 refills | Status: AC

## 2024-11-05 MED ORDER — INSULIN LISPRO PROT & LISPRO (75-25 MIX) 100 UNIT/ML KWIKPEN: 30.0000 [IU] | PEN_INJECTOR | Freq: Two times a day (BID) | SUBCUTANEOUS | 0 refills | Status: DC

## 2024-11-05 NOTE — Telephone Encounter (Signed)
 Appointment 11/28/24 Requested Prescriptions  Pending Prescriptions Disp Refills   Insulin  Lispro Prot & Lispro (HUMALOG  75/25 MIX) (75-25) 100 UNIT/ML Kwikpen 15 mL 0    Sig: Inject 30 Units into the skin 2 (two) times daily before a meal.     Endocrinology:  Diabetes - Insulins Failed - 11/05/2024  1:59 PM      Failed - Valid encounter within last 6 months    Recent Outpatient Visits           9 months ago Symptoms of upper respiratory infection (URI)   [place] [facility] [place], [place] A, FNP              Passed - HBA1C is between 0 and 7.9 and within 180 days    Hgb A1c MFr Bld  Date Value Ref Range Status  06/07/2024 5.8 (H) 4.8 - 5.6 % Final    Comment:             Prediabetes: 5.7 - 6.4          Diabetes: >6.4          Glycemic control for adults with diabetes: <7.0           Vitamin D , Ergocalciferol , (DRISDOL ) 1.25 MG (50000 UNIT) CAPS capsule 4 capsule 0    Sig: Take 1 capsule (50,000 Units total) by mouth every 7 (seven) days.     Endocrinology:  Vitamins - Vitamin D  Supplementation 2 Failed - 11/05/2024  1:59 PM      Failed - Manual Review: Route requests for 50,000 IU strength to the provider      Failed - Vitamin D  in normal range and within 360 days    Vit D, 25-Hydroxy  Date Value Ref Range Status  06/07/2024 5.2 (L) 30.0 - 100.0 ng/mL Final    Comment:    Vitamin D  deficiency has been defined by the Institute of Medicine and an Endocrine Society practice guideline as a level of serum 25-OH vitamin D  less than 20 ng/mL (1,2). The Endocrine Society went on to further define vitamin D  insufficiency as a level between 21 and 29 ng/mL (2). 1. IOM (Institute of Medicine). 2010. Dietary reference    intakes for calcium and D. [place]: The    [employer]. 2. Holick MF, Binkley [place], Bischoff-Ferrari HA, et al.    Evaluation, treatment, and prevention of vitamin D     deficiency: an Endocrine Society clinical practice     guideline. JCEM. 2011 Jul; 96(7):1911-30.          Passed - Ca in normal range and within 360 days    Calcium  Date Value Ref Range Status  06/07/2024 9.5 8.7 - 10.2 mg/dL Final         Passed - Valid encounter within last 12 months    Recent Outpatient Visits           9 months ago Symptoms of upper respiratory infection (URI)   [facility] [place], [place] A, FNP               Insulin  Pen Needle (PEN NEEDLES) 31G X 8 MM MISC 100 each 0    Sig: Use 2 (two) times daily before a meal with Humalog  Mix 75/25     Endocrinology: Diabetes - Testing Supplies Passed - 11/05/2024  1:59 PM      Passed - Valid encounter within last 12 months    Recent Outpatient Visits  9 months ago Symptoms of upper respiratory infection (URI)   [place] [facility] [place], [place], [place]

## 2024-11-05 NOTE — Telephone Encounter (Signed)
 Requested medication (s) are due for refill today -provider review   Requested medication (s) are on the active medication list -yes  Future visit scheduled -yes  Last refill: 10/07/24 #4  Notes to clinic: high dose Rx- requires provider review   Requested Prescriptions  Pending Prescriptions Disp Refills   Vitamin D , Ergocalciferol , (DRISDOL ) 1.25 MG (50000 UNIT) CAPS capsule 4 capsule 0    Sig: Take 1 capsule (50,000 Units total) by mouth every 7 (seven) days.     Endocrinology:  Vitamins - Vitamin D  Supplementation 2 Failed - 11/05/2024  1:59 PM      Failed - Manual Review: Route requests for 50,000 IU strength to the provider      Failed - Vitamin D  in normal range and within 360 days    Vit D, 25-Hydroxy  Date Value Ref Range Status  06/07/2024 5.2 (L) 30.0 - 100.0 ng/mL Final    Comment:    Vitamin D  deficiency has been defined by the Institute of Medicine and an Endocrine Society practice guideline as a level of serum 25-OH vitamin D  less than 20 ng/mL (1,2). The Endocrine Society went on to further define vitamin D  insufficiency as a level between 21 and 29 ng/mL (2). 1. IOM (Institute of Medicine). 2010. Dietary reference    intakes for calcium and D. [place]: The    [employer]. 2. Holick MF, Binkley [place], Bischoff-Ferrari HA, et al.    Evaluation, treatment, and prevention of vitamin D     deficiency: an Endocrine Society clinical practice    guideline. JCEM. 2011 Jul; 96(7):1911-30.          Passed - Ca in normal range and within 360 days    Calcium  Date Value Ref Range Status  06/07/2024 9.5 8.7 - 10.2 mg/dL Final         Passed - Valid encounter within last 12 months    Recent Outpatient Visits           9 months ago Symptoms of upper respiratory infection (URI)   [facility] [place], [place], [place]              Signed Prescriptions Disp Refills   Insulin  Lispro Prot & Lispro (HUMALOG  75/25 MIX) (75-25)  100 UNIT/ML Kwikpen 15 mL 0    Sig: Inject 30 Units into the skin 2 (two) times daily before a meal.     Endocrinology:  Diabetes - Insulins Failed - 11/05/2024  1:59 PM      Failed - Valid encounter within last 6 months    Recent Outpatient Visits           9 months ago Symptoms of upper respiratory infection (URI)   [place] [facility], [place] A, FNP              Passed - HBA1C is between 0 and 7.9 and within 180 days    Hgb A1c MFr Bld  Date Value Ref Range Status  06/07/2024 5.8 (H) 4.8 - 5.6 % Final    Comment:             Prediabetes: 5.7 - 6.4          Diabetes: >6.4          Glycemic control for adults with diabetes: <7.0           Insulin  Pen Needle (PEN NEEDLES) 31G X 8 MM MISC 100 each 0    Sig: Use  2 (two) times daily before a meal with Humalog  Mix 75/25     Endocrinology: Diabetes - Testing Supplies Passed - 11/05/2024  1:59 PM      Passed - Valid encounter within last 12 months    Recent Outpatient Visits           9 months ago Symptoms of upper respiratory infection (URI)   [place] [facility] [place], [place], [place]                 Requested Prescriptions  Pending Prescriptions Disp Refills   Vitamin D , Ergocalciferol , (DRISDOL ) 1.25 MG (50000 UNIT) CAPS capsule 4 capsule 0    Sig: Take 1 capsule (50,000 Units total) by mouth every 7 (seven) days.     Endocrinology:  Vitamins - Vitamin D  Supplementation 2 Failed - 11/05/2024  1:59 PM      Failed - Manual Review: Route requests for 50,000 IU strength to the provider      Failed - Vitamin D  in normal range and within 360 days    Vit D, 25-Hydroxy  Date Value Ref Range Status  06/07/2024 5.2 (L) 30.0 - 100.0 ng/mL Final    Comment:    Vitamin D  deficiency has been defined by the Institute of Medicine and an Endocrine Society practice guideline as a level of serum 25-OH vitamin D  less than 20 ng/mL (1,2). The Endocrine Society went on to further  define vitamin D  insufficiency as a level between 21 and 29 ng/mL (2). 1. IOM (Institute of Medicine). 2010. Dietary reference    intakes for calcium and D. [place]: The    [employer]. 2. Holick MF, Binkley [place], Bischoff-Ferrari HA, et al.    Evaluation, treatment, and prevention of vitamin D     deficiency: an Endocrine Society clinical practice    guideline. JCEM. 2011 Jul; 96(7):1911-30.          Passed - Ca in normal range and within 360 days    Calcium  Date Value Ref Range Status  06/07/2024 9.5 8.7 - 10.2 mg/dL Final         Passed - Valid encounter within last 12 months    Recent Outpatient Visits           9 months ago Symptoms of upper respiratory infection (URI)   [place] [facility] [place], [place], [place]              Signed Prescriptions Disp Refills   Insulin  Lispro Prot & Lispro (HUMALOG  75/25 MIX) (75-25) 100 UNIT/ML Kwikpen 15 mL 0    Sig: Inject 30 Units into the skin 2 (two) times daily before a meal.     Endocrinology:  Diabetes - Insulins Failed - 11/05/2024  1:59 PM      Failed - Valid encounter within last 6 months    Recent Outpatient Visits           9 months ago Symptoms of upper respiratory infection (URI)   [place] [facility], [place] A, FNP              Passed - HBA1C is between 0 and 7.9 and within 180 days    Hgb A1c MFr Bld  Date Value Ref Range Status  06/07/2024 5.8 (H) 4.8 - 5.6 % Final    Comment:             Prediabetes: 5.7 - 6.4  Diabetes: >6.4          Glycemic control for adults with diabetes: <7.0           Insulin  Pen Needle (PEN NEEDLES) 31G X 8 MM MISC 100 each 0    Sig: Use 2 (two) times daily before a meal with Humalog  Mix 75/25     Endocrinology: Diabetes - Testing Supplies Passed - 11/05/2024  1:59 PM      Passed - Valid encounter within last 12 months    Recent Outpatient Visits           9 months ago Symptoms of upper respiratory  infection (URI)   [facility] [place], [place], [place]

## 2024-11-06 ENCOUNTER — Other Ambulatory Visit: Payer: Self-pay | Admitting: Physician Assistant

## 2024-11-06 DIAGNOSIS — E1165 Type 2 diabetes mellitus with hyperglycemia: Secondary | ICD-10-CM

## 2024-11-11 ENCOUNTER — Telehealth: Payer: Self-pay | Admitting: Pharmacy Technician

## 2024-11-11 ENCOUNTER — Other Ambulatory Visit (HOSPITAL_COMMUNITY): Payer: Self-pay

## 2024-11-11 MED ORDER — VITAMIN D (ERGOCALCIFEROL) 1.25 MG (50000 UNIT) PO CAPS: 50000.0000 [IU] | ORAL_CAPSULE | ORAL | 0 refills | Status: DC

## 2024-11-11 NOTE — Telephone Encounter (Signed)
 Pharmacy Patient Advocate Encounter   Received notification from [employer] CMM KEY that prior authorization for HumaLOG  Mix 75/25 KwikPen (75-25) 100UNIT/ML pen-injectors is required/requested.   Insurance verification completed.   The patient is insured through [employer].   Per test claim:  SOFIA is preferred by the insurance.  If suggested medication is appropriate, Please send in a new RX and discontinue this one. If not, please advise as to why it's not appropriate so that we may request a Prior Authorization. Please note, some preferred medications may still require a PA.  If the suggested medications have not been trialed and there are no contraindications to their use, the PA will not be submitted, as it will not be approved.  New prescription is not required. Medication switched to generic at the pharmacy. Test claims shows refill too soon. Last filled 11/09/2024. Refill payable on or after 11/27/2024.

## 2024-11-15 ENCOUNTER — Other Ambulatory Visit: Payer: Self-pay | Admitting: Family Medicine

## 2024-11-15 DIAGNOSIS — E1159 Type 2 diabetes mellitus with other circulatory complications: Secondary | ICD-10-CM

## 2024-11-15 MED ORDER — AMLODIPINE BESYLATE 5 MG PO TABS: 5.0000 mg | ORAL_TABLET | Freq: Every day | ORAL | 0 refills | Status: DC

## 2024-11-15 NOTE — Telephone Encounter (Signed)
 Requested Prescriptions  Pending Prescriptions Disp Refills   amLODipine  (NORVASC ) 5 MG tablet 15 tablet 0    Sig: Take 1 tablet (5 mg total) by mouth daily.     Cardiovascular: Calcium Channel Blockers 2 Failed - 11/15/2024  1:36 PM      Failed - Last BP in normal range    BP Readings from Last 1 Encounters:  01/12/24 (!) 140/105         Failed - Valid encounter within last 6 months    Recent Outpatient Visits           10 months ago Symptoms of upper respiratory infection (URI)   [place] [facility] [place], [place], [place]              Passed - Last Heart Rate in normal range    Pulse Readings from Last 1 Encounters:  01/12/24 96

## 2024-11-15 NOTE — Telephone Encounter (Signed)
 Copied from CRM [id number]. Topic: Clinical - Medication Refill >> Nov 15, 2024  9:17 AM Montie POUR wrote: Medication:  amLODipine  (NORVASC ) 5 MG tablet  Has the patient contacted their pharmacy? Yes (Agent: If no, request that the patient contact the pharmacy for the refill. If patient does not wish to contact the pharmacy document the reason why and proceed with request.) (Agent: If yes, when and what did the pharmacy advise?) Pharmacy needs order to refill  This is the patient's preferred pharmacy:  [facility] [address] ([address]), [place] - [address] [place]) [place] [address] Phone: [phone] Fax: [phone]  Is this the correct pharmacy for this prescription? Yes If no, delete pharmacy and type the correct one.   Has the prescription been filled recently? No  Is the patient out of the medication? No She has 3-4 days left of medication  Has the patient been seen for an appointment in the last year OR does the patient have an upcoming appointment? Yes - 11/28/24 with PA-C [place]  Can we respond through MyChart? No  Agent: Please be advised that Rx refills may take up to 3 business days. We ask that you follow-up with your pharmacy.

## 2024-11-25 NOTE — Progress Notes (Unsigned)
 " Established patient visit  Patient: Deborah Meyers   DOB: 1990/12/23   34 y.o. Female  MRN: [id number] Visit Date: 11/28/2024  Today's healthcare provider: Jolynn Spencer, PA-C   No chief complaint on file.  Subjective       Discussed the use of AI scribe software for clinical note transcription with the patient, who gave verbal consent to proceed.  History of Present Illness        01/12/2024    9:59 AM  Depression screen PHQ 2/9  Decreased Interest 0  Down, Depressed, Hopeless 0  PHQ - 2 Score 0  Altered sleeping 1  Tired, decreased energy 2  Change in appetite 3  Feeling bad or failure about yourself  0  Trouble concentrating 0  Moving slowly or fidgety/restless 0  Suicidal thoughts 0  PHQ-9 Score 6   Difficult doing work/chores Somewhat difficult     Data saved with a previous flowsheet row definition      01/12/2024    9:59 AM  GAD 7 : Generalized Anxiety Score  Nervous, Anxious, on Edge 0   Control/stop worrying 0   Worry too much - different things 0   Trouble relaxing 0   Restless 0   Easily annoyed or irritable 0   Afraid - awful might happen 0   Total GAD 7 Score 0  Anxiety Difficulty Not difficult at all     Data saved with a previous flowsheet row definition    Medications: Show/hide medication list[1]  Review of Systems  All other systems reviewed and are negative.  All negative Except see HPI   {Insert previous labs (optional):23779} {See past labs  Heme  Chem  Endocrine  Serology  Results Review (optional):1}   Objective    There were no vitals taken for this visit. {Insert last BP/Wt (optional):23777}{See vitals history (optional):1}   Physical Exam Vitals reviewed.  Constitutional:      General: She is not in acute distress.    Appearance: Normal appearance. She is well-developed. She is not diaphoretic.  HENT:     Head: Normocephalic and atraumatic.  Eyes:     General: No scleral icterus.    Conjunctiva/sclera:  Conjunctivae normal.  Neck:     Thyroid: No thyromegaly.  Cardiovascular:     Rate and Rhythm: Normal rate and regular rhythm.     Pulses: Normal pulses.     Heart sounds: Normal heart sounds. No murmur heard. Pulmonary:     Effort: Pulmonary effort is normal. No respiratory distress.     Breath sounds: Normal breath sounds. No wheezing, rhonchi or rales.  Musculoskeletal:     Cervical back: Neck supple.     Right lower leg: No edema.     Left lower leg: No edema.  Lymphadenopathy:     Cervical: No cervical adenopathy.  Skin:    General: Skin is warm and dry.     Findings: No rash.  Neurological:     Mental Status: She is alert and oriented to person, place, and time. Mental status is at baseline.  Psychiatric:        Mood and Affect: Mood normal.        Behavior: Behavior normal.     No results found for any visits on 11/28/24.      Assessment and Plan Assessment & Plan     No orders of the defined types were placed in this encounter.   No follow-ups on file.   The patient was  advised to call back or seek an in-person evaluation if the symptoms worsen or if the condition fails to improve as anticipated.  I discussed the assessment and treatment plan with the patient. The patient was provided an opportunity to ask questions and all were answered. The patient agreed with the plan and demonstrated an understanding of the instructions.  I, Dollene Mallery, PA-C have reviewed all documentation for this visit. The documentation on 11/28/2024  for the exam, diagnosis, procedures, and orders are all accurate and complete.  Jolynn Spencer, [facility], MMS [facility] [phone] (phone) [phone] (fax)  [facility]     [1] Outpatient Medications Prior to Visit  Medication Sig   amLODipine  (NORVASC ) 5 MG tablet Take 1 tablet (5 mg total) by mouth daily.   Blood Glucose Monitoring Suppl (BLOOD GLUCOSE MONITOR SYSTEM) w/Device KIT Test 3 times a  day as directed   Blood Glucose Monitoring Suppl DEVI 1 each by Does not apply route in the morning, at noon, and at bedtime. May substitute to any manufacturer covered by patient's insurance.   Insulin  Lispro Prot & Lispro (HUMALOG  75/25 MIX) (75-25) 100 UNIT/ML Kwikpen Inject 30 Units into the skin 2 (two) times daily before a meal.   Insulin  Pen Needle (PEN NEEDLES) 31G X 8 MM MISC Use 2 (two) times daily before a meal with Humalog  Mix 75/25   metFORMIN  (GLUCOPHAGE ) 500 MG tablet Take 1 tablet (500 mg total) by mouth 2 (two) times daily with a meal.   TRUEplus Lancets 33G MISC test 3 times a day as directed   Vitamin D , Ergocalciferol , (DRISDOL ) 1.25 MG (50000 UNIT) CAPS capsule Take 1 capsule (50,000 Units total) by mouth every 7 (seven) days.   No facility-administered medications prior to visit.  "

## 2024-11-28 ENCOUNTER — Ambulatory Visit: Admitting: Physician Assistant

## 2024-11-28 ENCOUNTER — Encounter: Payer: Self-pay | Admitting: Physician Assistant

## 2024-11-28 ENCOUNTER — Ambulatory Visit

## 2024-11-28 ENCOUNTER — Encounter: Admitting: Physician Assistant

## 2024-11-28 VITALS — BP 190/110 | HR 99 | Ht 62.0 in | Wt 358.0 lb

## 2024-11-28 DIAGNOSIS — Z794 Long term (current) use of insulin: Secondary | ICD-10-CM

## 2024-11-28 DIAGNOSIS — Z23 Encounter for immunization: Secondary | ICD-10-CM

## 2024-11-28 DIAGNOSIS — R9431 Abnormal electrocardiogram [ECG] [EKG]: Secondary | ICD-10-CM | POA: Diagnosis not present

## 2024-11-28 DIAGNOSIS — E1165 Type 2 diabetes mellitus with hyperglycemia: Secondary | ICD-10-CM

## 2024-11-28 DIAGNOSIS — Z1159 Encounter for screening for other viral diseases: Secondary | ICD-10-CM

## 2024-11-28 DIAGNOSIS — E569 Vitamin deficiency, unspecified: Secondary | ICD-10-CM | POA: Diagnosis not present

## 2024-11-28 DIAGNOSIS — Z6841 Body Mass Index (BMI) 40.0 and over, adult: Secondary | ICD-10-CM

## 2024-11-28 DIAGNOSIS — E1159 Type 2 diabetes mellitus with other circulatory complications: Secondary | ICD-10-CM | POA: Diagnosis not present

## 2024-11-28 DIAGNOSIS — I152 Hypertension secondary to endocrine disorders: Secondary | ICD-10-CM

## 2024-11-28 DIAGNOSIS — E559 Vitamin D deficiency, unspecified: Secondary | ICD-10-CM | POA: Insufficient documentation

## 2024-11-28 DIAGNOSIS — R55 Syncope and collapse: Secondary | ICD-10-CM

## 2024-11-28 DIAGNOSIS — E282 Polycystic ovarian syndrome: Secondary | ICD-10-CM

## 2024-11-28 DIAGNOSIS — Z789 Other specified health status: Secondary | ICD-10-CM

## 2024-11-28 DIAGNOSIS — Z124 Encounter for screening for malignant neoplasm of cervix: Secondary | ICD-10-CM

## 2024-11-28 DIAGNOSIS — N1831 Chronic kidney disease, stage 3a: Secondary | ICD-10-CM

## 2024-11-28 MED ORDER — INSULIN LISPRO PROT & LISPRO (75-25 MIX) 100 UNIT/ML KWIKPEN: 30.0000 [IU] | PEN_INJECTOR | Freq: Two times a day (BID) | SUBCUTANEOUS | 0 refills | Status: DC

## 2024-11-28 MED ORDER — HYDROCHLOROTHIAZIDE 12.5 MG PO CAPS: 12.5000 mg | ORAL_CAPSULE | Freq: Two times a day (BID) | ORAL | 1 refills | Status: AC

## 2024-11-28 MED ORDER — AMLODIPINE BESYLATE 5 MG PO TABS: 5.0000 mg | ORAL_TABLET | Freq: Every day | ORAL | 0 refills | Status: AC

## 2024-11-28 MED ORDER — FREESTYLE LIBRE 3 PLUS SENSOR MISC: 3 refills | Status: AC

## 2024-11-28 MED ORDER — BASAGLAR KWIKPEN 100 UNIT/ML ~~LOC~~ SOPN: 36.0000 [IU] | PEN_INJECTOR | Freq: Every day | SUBCUTANEOUS | 2 refills | Status: DC

## 2024-11-28 MED ORDER — VALSARTAN 40 MG PO TABS: 40.0000 mg | ORAL_TABLET | Freq: Two times a day (BID) | ORAL | 0 refills | Status: AC

## 2024-11-28 MED ORDER — METFORMIN HCL ER 500 MG PO TB24: 1000.0000 mg | ORAL_TABLET | Freq: Every day | ORAL | 0 refills | Status: AC

## 2024-11-28 NOTE — Patient Instructions (Addendum)
 Thanks for visiting with me today!  Discontinue insulin  lispro 75/25 Start Basaglar  36 units once daily Discontinue metformin  Start metformin  extended-release 500 mg 2 tablets daily

## 2024-11-28 NOTE — Progress Notes (Signed)
 "  S:     Reason for visit: ?  Deborah Meyers is a 34 y.o. female with a history of diabetes (type 2), who presents today for an initial diabetes Face to Face pharmacotherapy visit.? Pertinent PMH also includes HTN, PCOS, CKD stage 3a, obesity.  They were referred to the pharmacist by their PCP for assistance in managing diabetes, hypertension, and hyperlipidemia/cardiovascular risk reduction.  Care Team: Primary Care Provider: Ostwalt, Janna, PA-C  Patient was seen today by her PCP. BP was elevated at 213/131 mmHg. Patient was instructed to start valsartan  40 mg BID and hydrochlorothiazide  12.5 mg BID.   Current diabetes medications include: metformin  500 mg BID (taking 2 tablets daily), Humalog  75/25 30 units BID Previous diabetes medications include: none Current hypertension medications include: amlodipine  5 mg daily Current hyperlipidemia medications include: none  Patient reports missing her evening dose of insulin  about once per week.   Have you been experiencing any side effects to the medications prescribed? no Do you have any problems obtaining medications due to transportation or finances? no Insurance coverage: [place] [employer]  Patient denies hypoglycemic events.  Reported home fasting blood sugars: not checking  Patient denies nocturia (nighttime urination).  Patient denies neuropathy (nerve pain).  DM Prevention:  Statin: Not taking.?  ACE/ARB: No History of chronic kidney disease? yes Last urinary albumin/creatinine ratio:  Lab Results  Component Value Date   MICRALBCREAT 19 06/07/2024   Last eye exam:     Last foot exam: No foot exam found Tobacco Use:  Tobacco Use: Low Risk (11/28/2024)   Patient History    Smoking Tobacco Use: Never    Smokeless Tobacco Use: Never    Passive Exposure: Not on file   O:    Vitals:  Wt Readings from Last 3 Encounters:  11/28/24 (!) 358 lb (162.4 kg)  01/12/24 (!) 337 lb (152.9 kg)  01/12/24 (!) 347 lb 14.4 oz  (157.8 kg)   BP Readings from Last 3 Encounters:  11/28/24 (!) 190/110  01/12/24 (!) 140/105  01/12/24 (!) 125/92   Pulse Readings from Last 3 Encounters:  11/28/24 99  01/12/24 96  01/12/24 (!) 112     Labs:?  Lab Results  Component Value Date   HGBA1C 5.8 (H) 06/07/2024   HGBA1C 12.4 (H) 09/16/2023   GLUCOSE 97 06/07/2024   MICRALBCREAT 19 06/07/2024   CREATININE 1.37 (H) 06/07/2024   CREATININE 1.65 (H) 01/12/2024   CREATININE 1.50 (H) 09/18/2023    Lab Results  Component Value Date   CHOL 154 06/07/2024   LDLCALC 89 06/07/2024   LDLCALC 86 09/16/2023   HDL 46 06/07/2024   TRIG 104 06/07/2024   TRIG 223 (H) 09/16/2023   ALT 31 06/07/2024   AST 21 06/07/2024      Chemistry      Component Value Date/Time   NA 140 06/07/2024 0839   K 4.7 06/07/2024 0839   CL 105 06/07/2024 0839   CO2 19 (L) 06/07/2024 0839   BUN 17 06/07/2024 0839   CREATININE 1.37 (H) 06/07/2024 0839      Component Value Date/Time   CALCIUM 9.5 06/07/2024 0839   ALKPHOS 130 (H) 06/07/2024 0839   AST 21 06/07/2024 0839   ALT 31 06/07/2024 0839   BILITOT 0.4 06/07/2024 0839       The ASCVD Risk score (Arnett DK, et al., 2019) failed to calculate for the following reasons:   The 2019 ASCVD risk score is only valid for ages 36 to 6  Lab Results  Component Value Date   MICRALBCREAT 19 06/07/2024    A/P: Diabetes currently controlled with a most recent A1c of 5.8% on 06/07/24. Unable to fully assess control given lack of home BG readings. Patient endorses medication adherence. Will switch from mixed to basal insulin  product. Current TTD = 60 units daily, which is 45 units of basal insulin . Will decrease by 20% as switching insulin  products. Could consider possible GLP1 use at follow up. Patient reports she is taking her current metformin  as 2 tablets once daily. Will switch to an ER product as current formulation does not last all day long.  -Started basal insulin  Basaglar  (insulin   glargine)  36 units daily.  -Discontinued Humalog  (insulin  lispro) 75/25 insulin .  -Switched metformin  from IR to ER 500 mg 2 tablets daily.  -Patient educated on purpose, proper use, and potential adverse effects of insulin .  -Extensively discussed pathophysiology of diabetes, recommended lifestyle interventions, dietary effects on blood sugar control.  -Counseled on s/sx of and management of hypoglycemia.  -Next A1c anticipated today.  -Educated on and started [employer] 3+ in clinic today. Patient provided sample of sensor and reader as phone is not compatible   Written patient instructions provided. Patient verbalized understanding of treatment plan.  Total time in face to face counseling 45 minutes.     Follow-up:  Pharmacist on 12/18/24 PCP clinic visit on 12/06/24  Peyton CHARLENA Ferries, PharmD, BCACP, CPP Clinical Pharmacist [facility] [phone]   "

## 2024-11-28 NOTE — Progress Notes (Signed)
 " Established patient visit  Patient: Deborah Meyers   DOB: [birth year]   34 y.o. Female  MRN: [id number] Visit Date: 11/28/2024  Today's healthcare provider: Jolynn Spencer, PA-C   Chief Complaint  Patient presents with   Medical Management of Chronic Issues    HTN,T2DM   Transitions Of Care    Former patient of Curtis Boom. Need Meds refill   Subjective     HPI     Medical Management of Chronic Issues    Additional comments: HTN,T2DM        Transitions Of Care    Additional comments: Former patient of Curtis Boom. Need Meds refill      Last edited by Rosas, Joseline E, CMA on 11/28/2024  9:49 AM.       Discussed the use of AI scribe software for clinical note transcription with the patient, who gave verbal consent to proceed.  History of Present Illness Deborah Meyers is a 34 year old female with hypertension who presents with elevated blood pressure.  She has hypertension with elevated blood pressure today, without weakness, tingling, numbness, burning in the extremities, headache, or dizziness. Her current antihypertensive medications are amlodipine . She also has diabetes and uses an insulin  regimen.       11/28/2024    9:52 AM 01/12/2024    9:59 AM  Depression screen PHQ 2/9  Decreased Interest 0 0  Down, Depressed, Hopeless 0 0  PHQ - 2 Score 0 0  Altered sleeping  1  Tired, decreased energy  2  Change in appetite  3  Feeling bad or failure about yourself   0  Trouble concentrating  0  Moving slowly or fidgety/restless  0  Suicidal thoughts  0  PHQ-9 Score  6   Difficult doing work/chores  Somewhat difficult     Data saved with a previous flowsheet row definition      11/28/2024    9:52 AM 01/12/2024    9:59 AM  GAD 7 : Generalized Anxiety Score  Nervous, Anxious, on Edge 0 0   Control/stop worrying 0 0   Worry too much - different things 0 0   Trouble relaxing 0 0   Restless 0 0   Easily annoyed or irritable 0 0   Afraid - awful  might happen 0 0   Total GAD 7 Score 0 0  Anxiety Difficulty Not difficult at all Not difficult at all     Data saved with a previous flowsheet row definition    Medications: Show/hide medication list[1]  Review of Systems All negative Except see HPI       Objective    BP (!) 190/110 (BP Location: Left Arm, Patient Position: Sitting, Cuff Size: Large)   Pulse 99   Ht 5' 2 (1.575 m)   Wt (!) 358 lb (162.4 kg)   SpO2 100%   BMI 65.48 kg/m     Physical Exam Vitals reviewed.  Constitutional:      General: She is not in acute distress.    Appearance: Normal appearance. She is well-developed. She is obese. She is not diaphoretic.  HENT:     Head: Normocephalic and atraumatic.  Eyes:     General: No scleral icterus.    Conjunctiva/sclera: Conjunctivae normal.  Neck:     Thyroid: No thyromegaly.  Cardiovascular:     Rate and Rhythm: Normal rate and regular rhythm.     Pulses: Normal pulses.     Heart sounds: Normal heart sounds. No  murmur heard. Pulmonary:     Effort: Pulmonary effort is normal. No respiratory distress.     Breath sounds: Normal breath sounds. No wheezing, rhonchi or rales.  Musculoskeletal:     Cervical back: Neck supple.     Right lower leg: No edema.     Left lower leg: No edema.  Lymphadenopathy:     Cervical: No cervical adenopathy.  Skin:    General: Skin is warm and dry.     Findings: No rash.  Neurological:     Mental Status: She is alert and oriented to person, place, and time. Mental status is at baseline.  Psychiatric:        Mood and Affect: Mood normal.        Behavior: Behavior normal.      No results found for any visits on 11/28/24.      Assessment and Plan Assessment & Plan Type 2 diabetes mellitus with hyperglycemia and hypertension Chronic and unstable Concerns about insulin  adherence and elevated blood pressure over 200 mmHg. Discussed basal insulin  regimen switch. - Switched to basal insulin  regimen with higher  equivalent dose. - Discontinued bolus insulin . - Ordered lab work: A1c, TSH, and other relevant tests. - Monitor blood pressure closely. Referral to in clinic pharmacist was placed for urgent consulatition Will follow-up in a week  Abnormal electrocardiogram Sinus tachycardia noted, suspected ischemia. Concerns about elevated blood pressure and cardiovascular risks. - Monitor blood pressure closely. Troponin was ordered Will follow-up  Uncontrolled type 2 diabetes mellitus with hyperglycemia, without long-term current use of insulin  (HCC) (Primary)  - Insulin  Lispro Prot & Lispro (HUMALOG  75/25 MIX) (75-25) 100 UNIT/ML Kwikpen; Inject 30 Units into the skin 2 (two) times daily before a meal.  Dispense: 15 mL; Refill: 0 - HM Diabetes Foot Exam - AMB Referral [facility]I Care Management - Comprehensive metabolic panel with GFR - Hemoglobin A1c - TSH + free T4 - Direct LDL  Hypertension associated with diabetes (HCC) Chronic and unstable Per pt, she has been taking all her current medicaitons  - amLODipine  (NORVASC ) 5 MG tablet; Take 1 tablet (5 mg total) by mouth daily.  Dispense: 15 tablet; Refill: 0 - AMB Referral [facility]BCI Care Management - EKG 12-Lead - valsartan  (DIOVAN ) 40 MG tablet; Take 1 tablet (40 mg total) by mouth 2 (two) times daily.  Dispense: 60 tablet; Refill: 0 Start with once daily - hydrochlorothiazide  (MICROZIDE ) 12.5 MG capsule; Take 1 capsule (12.5 mg total) by mouth 2 (two) times daily.  Dispense: 60 capsule; Refill: 1 Start with once daily. - Comprehensive metabolic panel with GFR - CBC with Differential/Platelet - Hemoglobin A1c - TSH + free T4 - Direct LDL Will reassess after  receiving lab results Will FU  [facility] care From [facility]  Abnormal EKG - Troponin T  Morbid obesity (HCC) Chronic and unstable Associated with dm and htn ordered - Troponin T - Comprehensive metabolic panel with GFR - CBC with Differential/Platelet - Hemoglobin  A1c - TSH + free T4 - Direct LDL - Vitamin D  (25 hydroxy) Will reassess after  receiving lab results Will FOLLOW-UP  Vitamin deficiency - Vitamin D  (25 hydroxy)   Orders Placed This Encounter  Procedures   Troponin T   Comprehensive metabolic panel with GFR   CBC with Differential/Platelet   Hemoglobin A1c   TSH + free T4   Direct LDL   Vitamin D  (25 hydroxy)   AMB Referral [facility]BCI Care Management    Referral Priority:   Routine  Referral Type:   Consultation    Referral Reason:   Care Coordination    Number of Visits Requested:   1   EKG 12-Lead   HM Diabetes Foot Exam    Return in about 1 week (around 12/05/2024) for BP f/u.   The patient was advised to call back or seek an in-person evaluation if the symptoms worsen or if the condition fails to improve as anticipated.  I discussed the assessment and treatment plan with the patient. The patient was provided an opportunity to ask questions and all were answered. The patient agreed with the plan and demonstrated an understanding of the instructions.  I, Johntae Broxterman, PA-C have reviewed all documentation for this visit. The documentation on 11/28/2024  for the exam, diagnosis, procedures, and orders are all accurate and complete.  Jolynn Spencer, [facility], MMS [facility] [phone] (phone) [phone] (fax)  [facility]    [1]  Outpatient Medications Prior to Visit  Medication Sig   Blood Glucose Monitoring Suppl (BLOOD GLUCOSE MONITOR SYSTEM) w/Device KIT Test 3 times a day as directed   Blood Glucose Monitoring Suppl DEVI 1 each by Does not apply route in the morning, at noon, and at bedtime. May substitute to any manufacturer covered by patient's insurance.   Insulin  Pen Needle (PEN NEEDLES) 31G X 8 MM MISC Use 2 (two) times daily before a meal with Humalog  Mix 75/25   metFORMIN  (GLUCOPHAGE ) 500 MG tablet Take 1 tablet (500 mg total) by mouth 2 (two) times daily with a meal.   TRUEplus  Lancets 33G MISC test 3 times a day as directed   Vitamin D , Ergocalciferol , (DRISDOL ) 1.25 MG (50000 UNIT) CAPS capsule Take 1 capsule (50,000 Units total) by mouth every 7 (seven) days.   [DISCONTINUED] amLODipine  (NORVASC ) 5 MG tablet Take 1 tablet (5 mg total) by mouth daily.   [DISCONTINUED] Insulin  Lispro Prot & Lispro (HUMALOG  75/25 MIX) (75-25) 100 UNIT/ML Kwikpen Inject 30 Units into the skin 2 (two) times daily before a meal.   No facility-administered medications prior to visit.   "

## 2024-11-29 LAB — CBC WITH DIFFERENTIAL/PLATELET
Basophils Absolute: 0.1 10*3/uL (ref 0.0–0.2)
Basos: 1 %
EOS (ABSOLUTE): 0.1 10*3/uL (ref 0.0–0.4)
Eos: 1 %
Hematocrit: 42.8 % (ref 34.0–46.6)
Hemoglobin: 13.3 g/dL (ref 11.1–15.9)
Immature Grans (Abs): 0 10*3/uL (ref 0.0–0.1)
Immature Granulocytes: 0 %
Lymphocytes Absolute: 2.8 10*3/uL (ref 0.7–3.1)
Lymphs: 29 %
MCH: 26.3 pg — ABNORMAL LOW (ref 26.6–33.0)
MCHC: 31.1 g/dL — ABNORMAL LOW (ref 31.5–35.7)
MCV: 85 fL (ref 79–97)
Monocytes Absolute: 0.7 10*3/uL (ref 0.1–0.9)
Monocytes: 7 %
Neutrophils Absolute: 5.7 10*3/uL (ref 1.4–7.0)
Neutrophils: 62 %
Platelets: 325 10*3/uL (ref 150–450)
RBC: 5.05 x10E6/uL (ref 3.77–5.28)
RDW: 14.7 % (ref 11.7–15.4)
WBC: 9.4 10*3/uL (ref 3.4–10.8)

## 2024-11-29 LAB — COMPREHENSIVE METABOLIC PANEL WITH GFR
ALT: 54 [IU]/L — ABNORMAL HIGH (ref 0–32)
AST: 29 [IU]/L (ref 0–40)
Albumin: 4.4 g/dL (ref 3.9–4.9)
Alkaline Phosphatase: 99 [IU]/L (ref 41–116)
BUN/Creatinine Ratio: 14 (ref 9–23)
BUN: 19 mg/dL (ref 6–20)
Bilirubin Total: 0.4 mg/dL (ref 0.0–1.2)
CO2: 18 mmol/L — ABNORMAL LOW (ref 20–29)
Calcium: 9.7 mg/dL (ref 8.7–10.2)
Chloride: 105 mmol/L (ref 96–106)
Creatinine, Ser: 1.33 mg/dL — ABNORMAL HIGH (ref 0.57–1.00)
Globulin, Total: 3.4 g/dL (ref 1.5–4.5)
Glucose: 110 mg/dL — ABNORMAL HIGH (ref 70–99)
Potassium: 4.9 mmol/L (ref 3.5–5.2)
Sodium: 141 mmol/L (ref 134–144)
Total Protein: 7.8 g/dL (ref 6.0–8.5)
eGFR: 54 mL/min/{1.73_m2} — ABNORMAL LOW

## 2024-11-29 LAB — VITAMIN D 25 HYDROXY (VIT D DEFICIENCY, FRACTURES): Vit D, 25-Hydroxy: 21 ng/mL — ABNORMAL LOW (ref 30.0–100.0)

## 2024-11-29 LAB — TSH+FREE T4
Free T4: 1.3 ng/dL (ref 0.82–1.77)
TSH: 1.64 u[IU]/mL (ref 0.450–4.500)

## 2024-11-29 LAB — TROPONIN T: Troponin T (Highly Sensitive): 12 ng/L (ref 0–14)

## 2024-11-29 LAB — LDL CHOLESTEROL, DIRECT: LDL Direct: 79 mg/dL (ref 0–99)

## 2024-11-29 LAB — HEMOGLOBIN A1C
Est. average glucose Bld gHb Est-mCnc: 114 mg/dL
Hgb A1c MFr Bld: 5.6 % (ref 4.8–5.6)

## 2024-12-01 NOTE — Progress Notes (Unsigned)
 " Established patient visit  Patient: Deborah Meyers   DOB: Sep 13, 1991   34 y.o. Female  MRN: [id number] Visit Date: 12/06/2024  Today's healthcare provider: Jolynn Spencer, PA-C   No chief complaint on file.  Subjective       Discussed the use of AI scribe software for clinical note transcription with the patient, who gave verbal consent to proceed.  History of Present Illness        11/28/2024    9:52 AM 01/12/2024    9:59 AM  Depression screen PHQ 2/9  Decreased Interest 0 0  Down, Depressed, Hopeless 0 0  PHQ - 2 Score 0 0  Altered sleeping  1  Tired, decreased energy  2  Change in appetite  3  Feeling bad or failure about yourself   0  Trouble concentrating  0  Moving slowly or fidgety/restless  0  Suicidal thoughts  0  PHQ-9 Score  6   Difficult doing work/chores  Somewhat difficult     Data saved with a previous flowsheet row definition      11/28/2024    9:52 AM 01/12/2024    9:59 AM  GAD 7 : Generalized Anxiety Score  Nervous, Anxious, on Edge 0 0   Control/stop worrying 0 0   Worry too much - different things 0 0   Trouble relaxing 0 0   Restless 0 0   Easily annoyed or irritable 0 0   Afraid - awful might happen 0 0   Total GAD 7 Score 0 0  Anxiety Difficulty Not difficult at all Not difficult at all     Data saved with a previous flowsheet row definition    Medications: Show/hide medication list[1]  Review of Systems  All other systems reviewed and are negative.  All negative Except see HPI   {Insert previous labs (optional):23779} {See past labs  Heme  Chem  Endocrine  Serology  Results Review (optional):1}   Objective    There were no vitals taken for this visit. {Insert last BP/Wt (optional):23777}{See vitals history (optional):1}   Physical Exam Vitals reviewed.  Constitutional:      General: She is not in acute distress.    Appearance: Normal appearance. She is well-developed. She is not diaphoretic.  HENT:     Head:  Normocephalic and atraumatic.  Eyes:     General: No scleral icterus.    Conjunctiva/sclera: Conjunctivae normal.  Neck:     Thyroid: No thyromegaly.  Cardiovascular:     Rate and Rhythm: Normal rate and regular rhythm.     Pulses: Normal pulses.     Heart sounds: Normal heart sounds. No murmur heard. Pulmonary:     Effort: Pulmonary effort is normal. No respiratory distress.     Breath sounds: Normal breath sounds. No wheezing, rhonchi or rales.  Musculoskeletal:     Cervical back: Neck supple.     Right lower leg: No edema.     Left lower leg: No edema.  Lymphadenopathy:     Cervical: No cervical adenopathy.  Skin:    General: Skin is warm and dry.     Findings: No rash.  Neurological:     Mental Status: She is alert and oriented to person, place, and time. Mental status is at baseline.  Psychiatric:        Mood and Affect: Mood normal.        Behavior: Behavior normal.     No results found for any visits on 12/06/24.  Assessment and Plan Assessment & Plan     No orders of the defined types were placed in this encounter.   No follow-ups on file.   The patient was advised to call back or seek an in-person evaluation if the symptoms worsen or if the condition fails to improve as anticipated.  I discussed the assessment and treatment plan with the patient. The patient was provided an opportunity to ask questions and all were answered. The patient agreed with the plan and demonstrated an understanding of the instructions.  I, Desire Fulp, PA-C have reviewed all documentation for this visit. The documentation on 12/06/2024  for the exam, diagnosis, procedures, and orders are all accurate and complete.  Jolynn Spencer, [facility], MMS [facility] [phone] (phone) [phone] (fax)  [facility]     [1] Outpatient Medications Prior to Visit  Medication Sig   amLODipine  (NORVASC ) 5 MG tablet Take 1 tablet (5 mg total) by mouth  daily.   Blood Glucose Monitoring Suppl (BLOOD GLUCOSE MONITOR SYSTEM) w/Device KIT Test 3 times a day as directed   Blood Glucose Monitoring Suppl DEVI 1 each by Does not apply route in the morning, at noon, and at bedtime. May substitute to any manufacturer covered by patient's insurance.   Continuous Glucose Sensor (FREESTYLE LIBRE 3 PLUS SENSOR) MISC Change sensor every 15 days.   hydrochlorothiazide  (MICROZIDE ) 12.5 MG capsule Take 1 capsule (12.5 mg total) by mouth 2 (two) times daily.   Insulin  Glargine (BASAGLAR  KWIKPEN) 100 UNIT/ML Inject 36 Units into the skin daily.   Insulin  Pen Needle (PEN NEEDLES) 31G X 8 MM MISC Use 2 (two) times daily before a meal with Humalog  Mix 75/25   metFORMIN  (GLUCOPHAGE -XR) 500 MG 24 hr tablet Take 2 tablets (1,000 mg total) by mouth daily with breakfast.   TRUEplus Lancets 33G MISC test 3 times a day as directed   valsartan  (DIOVAN ) 40 MG tablet Take 1 tablet (40 mg total) by mouth 2 (two) times daily.   Vitamin D , Ergocalciferol , (DRISDOL ) 1.25 MG (50000 UNIT) CAPS capsule Take 1 capsule (50,000 Units total) by mouth every 7 (seven) days.   No facility-administered medications prior to visit.  "

## 2024-12-02 ENCOUNTER — Ambulatory Visit: Payer: Self-pay | Admitting: Physician Assistant

## 2024-12-02 ENCOUNTER — Telehealth: Payer: Self-pay | Admitting: Pharmacy Technician

## 2024-12-02 ENCOUNTER — Other Ambulatory Visit (HOSPITAL_COMMUNITY): Payer: Self-pay

## 2024-12-02 DIAGNOSIS — E559 Vitamin D deficiency, unspecified: Secondary | ICD-10-CM

## 2024-12-02 DIAGNOSIS — E1165 Type 2 diabetes mellitus with hyperglycemia: Secondary | ICD-10-CM

## 2024-12-02 MED ORDER — LANTUS SOLOSTAR 100 UNIT/ML ~~LOC~~ SOPN: 36.0000 [IU] | PEN_INJECTOR | Freq: Every day | SUBCUTANEOUS | 2 refills | Status: AC

## 2024-12-02 MED ORDER — VITAMIN D (ERGOCALCIFEROL) 1.25 MG (50000 UNIT) PO CAPS: 50000.0000 [IU] | ORAL_CAPSULE | ORAL | 0 refills | Status: AC

## 2024-12-02 NOTE — Telephone Encounter (Signed)
 Pharmacy Patient Advocate Encounter  Received notification from [facility] MEDICAID that Prior Authorization for FreeStyle Libre 3 Plus Sensor  has been APPROVED from 12/02/24 to 06/01/25. Ran test claim, Copay is $4.00. This test claim was processed through [facility]- copay amounts may vary at other pharmacies due to pharmacy/plan contracts, or as the patient moves through the different stages of their insurance plan.   PA #/Case ID/Reference #: [id number]

## 2024-12-02 NOTE — Telephone Encounter (Signed)
 Looks like patient has upcoming appt on 12/07/23 and was recently seen by pharmacist.

## 2024-12-04 ENCOUNTER — Other Ambulatory Visit (HOSPITAL_COMMUNITY): Payer: Self-pay

## 2024-12-04 ENCOUNTER — Telehealth: Payer: Self-pay

## 2024-12-04 NOTE — Telephone Encounter (Signed)
 Copied from CRM [id number]. Topic: Appointments - Appointment Info/Confirmation >> Dec 04, 2024  1:00 PM Delon DASEN wrote: Patient is asking if they still want her to come in for her appt on Friday, her sensor is only going to have 2 days of information instead of 10 due to the sensor falling off- please call to advise [phone]

## 2024-12-06 ENCOUNTER — Ambulatory Visit: Admitting: Physician Assistant

## 2024-12-13 ENCOUNTER — Ambulatory Visit: Admitting: Physician Assistant

## 2024-12-18 ENCOUNTER — Ambulatory Visit
